# Patient Record
Sex: Female | Born: 1950 | Race: Black or African American | Hispanic: No | Smoking: Never smoker
Health system: Southern US, Community
[De-identification: ages and names within clinical notes are randomized; demographics above are authoritative.]

## PROBLEM LIST (undated history)

## (undated) DIAGNOSIS — E079 Disorder of thyroid, unspecified: Secondary | ICD-10-CM

## (undated) DIAGNOSIS — I10 Essential (primary) hypertension: Secondary | ICD-10-CM

## (undated) HISTORY — PX: ABDOMINAL HYSTERECTOMY: SHX81

## (undated) HISTORY — PX: ELBOW SURGERY: SHX618

---

## 2000-12-25 ENCOUNTER — Encounter: Payer: Self-pay | Admitting: Obstetrics and Gynecology

## 2000-12-25 ENCOUNTER — Ambulatory Visit (HOSPITAL_COMMUNITY): Admission: RE | Admit: 2000-12-25 | Discharge: 2000-12-25 | Payer: Self-pay | Admitting: Obstetrics and Gynecology

## 2001-02-10 ENCOUNTER — Ambulatory Visit (HOSPITAL_COMMUNITY): Admission: RE | Admit: 2001-02-10 | Discharge: 2001-02-10 | Payer: Self-pay | Admitting: Family Medicine

## 2001-02-10 ENCOUNTER — Encounter: Payer: Self-pay | Admitting: Family Medicine

## 2003-04-06 ENCOUNTER — Ambulatory Visit (HOSPITAL_COMMUNITY): Admission: RE | Admit: 2003-04-06 | Discharge: 2003-04-06 | Payer: Self-pay | Admitting: Family Medicine

## 2003-05-19 ENCOUNTER — Ambulatory Visit (HOSPITAL_COMMUNITY): Admission: RE | Admit: 2003-05-19 | Discharge: 2003-05-19 | Payer: Self-pay | Admitting: Internal Medicine

## 2004-04-27 ENCOUNTER — Ambulatory Visit (HOSPITAL_COMMUNITY): Admission: RE | Admit: 2004-04-27 | Discharge: 2004-04-27 | Payer: Self-pay | Admitting: Internal Medicine

## 2004-09-14 ENCOUNTER — Ambulatory Visit (HOSPITAL_COMMUNITY): Admission: RE | Admit: 2004-09-14 | Discharge: 2004-09-14 | Payer: Self-pay | Admitting: Family Medicine

## 2005-04-30 ENCOUNTER — Ambulatory Visit (HOSPITAL_COMMUNITY): Admission: RE | Admit: 2005-04-30 | Discharge: 2005-04-30 | Payer: Self-pay | Admitting: Family Medicine

## 2006-08-20 ENCOUNTER — Ambulatory Visit (HOSPITAL_COMMUNITY): Admission: RE | Admit: 2006-08-20 | Discharge: 2006-08-20 | Payer: Self-pay | Admitting: Family Medicine

## 2006-12-08 ENCOUNTER — Other Ambulatory Visit: Admission: RE | Admit: 2006-12-08 | Discharge: 2006-12-08 | Payer: Self-pay | Admitting: Obstetrics & Gynecology

## 2008-01-08 ENCOUNTER — Ambulatory Visit (HOSPITAL_COMMUNITY): Admission: RE | Admit: 2008-01-08 | Discharge: 2008-01-08 | Payer: Self-pay | Admitting: Family Medicine

## 2008-04-29 ENCOUNTER — Other Ambulatory Visit: Admission: RE | Admit: 2008-04-29 | Discharge: 2008-04-29 | Payer: Self-pay | Admitting: Family Medicine

## 2008-09-05 ENCOUNTER — Encounter: Admission: RE | Admit: 2008-09-05 | Discharge: 2008-09-05 | Payer: Self-pay | Admitting: Family Medicine

## 2009-01-11 ENCOUNTER — Ambulatory Visit (HOSPITAL_COMMUNITY): Admission: RE | Admit: 2009-01-11 | Discharge: 2009-01-11 | Payer: Self-pay | Admitting: Family Medicine

## 2009-03-24 ENCOUNTER — Other Ambulatory Visit: Admission: RE | Admit: 2009-03-24 | Discharge: 2009-03-24 | Payer: Self-pay | Admitting: Obstetrics and Gynecology

## 2010-06-29 NOTE — Op Note (Signed)
NAME:  Brittany Diaz, Brittany Diaz                        ACCOUNT NO.:  [id number]   MEDICAL RECORD NO.:  [id number]                   PATIENT TYPE:  AMB   LOCATION:  DAY                                  FACILITY:  [facility]   PHYSICIAN:  R. Roetta Sessions, M.D.              DATE OF BIRTH:  [birth year]   DATE OF PROCEDURE:  05/19/2003  DATE OF DISCHARGE:                                 OPERATIVE REPORT   PROCEDURE:  Screening colonoscopy.   INDICATIONS FOR PROCEDURE:  The patient is a 60 year old lady who comes for  screening colonoscopy.  She is devoid of any lower GI tract symptoms.  There  is no family history of colorectal neoplasia.  She has never had her colon  imaged previously.  Colonoscopy is now being done as a standard screening  maneuver.  This approach has been discussed with the patient at length.  The  potential risks, benefits, and alternatives have been reviewed and questions  answered.  She is agreeable.  Please see my handwritten H&P for more  information.   PROCEDURE:  O2 saturation, blood pressure, pulses, and respirations were  monitored throughout the entirety of the procedure.  Conscious sedation was  with Versed 2 mg IV, Demerol 50 mg IV in divided doses.  The instrument used  was the Olympus pediatric colonoscope.   FINDINGS:  Digital rectal examination revealed no abnormalities.   ENDOSCOPIC FINDINGS:  The prep was good.   Rectum:  Examination of the rectal mucosa including retroflex view of the  anal verge revealed no abnormalities.   Colon:  The colonic mucosa was surveyed from the rectosigmoid junction  through the left, transverse, right colon to the area of the appendiceal  orifice, ileocecal valve, and cecum.  These structures were well-seen and  photographed for the record.  From this level, the scope was slowly  withdrawn.  All previously mentioned mucosal surfaces were again seen.  The  distal terminal ileum was also intubated.  The colon and terminal ileum  appeared normal.  The patient tolerated the procedure well and was reactive  in endoscopy.   IMPRESSION:  1. Normal rectum.  2. Normal colon.  3. Normal terminal ileum.   RECOMMENDATIONS:  Repeat colonoscopy in 10 years.      ___________________________________________                                            Brittany Diaz, M.D.   RMR/MEDQ  D:  05/19/2003  T:  05/19/2003  Job:  [id number]

## 2011-10-15 ENCOUNTER — Other Ambulatory Visit (HOSPITAL_COMMUNITY): Payer: Self-pay | Admitting: Nurse Practitioner

## 2011-10-15 DIAGNOSIS — Z139 Encounter for screening, unspecified: Secondary | ICD-10-CM

## 2011-10-17 ENCOUNTER — Ambulatory Visit (HOSPITAL_COMMUNITY)
Admission: RE | Admit: 2011-10-17 | Discharge: 2011-10-17 | Disposition: A | Discharge status: Home / Self Care | Payer: PRIVATE HEALTH INSURANCE | Source: Ambulatory Visit | Attending: Nurse Practitioner | Admitting: Nurse Practitioner

## 2011-10-17 DIAGNOSIS — R922 Inconclusive mammogram: Secondary | ICD-10-CM | POA: Insufficient documentation

## 2011-10-17 DIAGNOSIS — Z139 Encounter for screening, unspecified: Secondary | ICD-10-CM

## 2011-10-21 ENCOUNTER — Other Ambulatory Visit (HOSPITAL_COMMUNITY): Payer: Self-pay | Admitting: Nurse Practitioner

## 2011-10-21 ENCOUNTER — Other Ambulatory Visit: Payer: Self-pay | Admitting: Nurse Practitioner

## 2011-10-21 DIAGNOSIS — R928 Other abnormal and inconclusive findings on diagnostic imaging of breast: Secondary | ICD-10-CM

## 2011-10-30 ENCOUNTER — Ambulatory Visit (HOSPITAL_COMMUNITY)
Admission: RE | Admit: 2011-10-30 | Discharge: 2011-10-30 | Disposition: A | Discharge status: Home / Self Care | Payer: PRIVATE HEALTH INSURANCE | Source: Ambulatory Visit | Attending: Nurse Practitioner | Admitting: Nurse Practitioner

## 2011-10-30 ENCOUNTER — Other Ambulatory Visit (HOSPITAL_COMMUNITY): Payer: Self-pay | Admitting: Nurse Practitioner

## 2011-10-30 DIAGNOSIS — R928 Other abnormal and inconclusive findings on diagnostic imaging of breast: Secondary | ICD-10-CM

## 2011-11-08 ENCOUNTER — Encounter (HOSPITAL_COMMUNITY): Payer: Self-pay | Admitting: *Deleted

## 2011-11-08 ENCOUNTER — Emergency Department (HOSPITAL_COMMUNITY)
Admission: EM | Admit: 2011-11-08 | Discharge: 2011-11-08 | Disposition: A | Discharge status: Home / Self Care | Payer: Self-pay | Attending: Emergency Medicine | Admitting: Emergency Medicine

## 2011-11-08 ENCOUNTER — Emergency Department (HOSPITAL_COMMUNITY): Payer: Self-pay

## 2011-11-08 DIAGNOSIS — I10 Essential (primary) hypertension: Secondary | ICD-10-CM | POA: Insufficient documentation

## 2011-11-08 DIAGNOSIS — M545 Low back pain, unspecified: Secondary | ICD-10-CM | POA: Insufficient documentation

## 2011-11-08 DIAGNOSIS — M549 Dorsalgia, unspecified: Secondary | ICD-10-CM

## 2011-11-08 DIAGNOSIS — R062 Wheezing: Secondary | ICD-10-CM | POA: Insufficient documentation

## 2011-11-08 DIAGNOSIS — IMO0001 Reserved for inherently not codable concepts without codable children: Secondary | ICD-10-CM | POA: Insufficient documentation

## 2011-11-08 DIAGNOSIS — E079 Disorder of thyroid, unspecified: Secondary | ICD-10-CM | POA: Insufficient documentation

## 2011-11-08 HISTORY — DX: Disorder of thyroid, unspecified: E07.9

## 2011-11-08 HISTORY — DX: Essential (primary) hypertension: I10

## 2011-11-08 MED ORDER — IBUPROFEN 800 MG PO TABS: 800.0000 mg | ORAL_TABLET | Freq: Three times a day (TID) | ORAL | Status: DC

## 2011-11-08 MED ORDER — CYCLOBENZAPRINE HCL 10 MG PO TABS: 10.0000 mg | ORAL_TABLET | Freq: Two times a day (BID) | ORAL | Status: DC | PRN

## 2011-11-08 MED ORDER — ALBUTEROL SULFATE HFA 108 (90 BASE) MCG/ACT IN AERS: 2.0000 | INHALATION_SPRAY | RESPIRATORY_TRACT | Status: DC | PRN

## 2011-11-08 NOTE — ED Notes (Addendum)
Pt c/o lower back pain that is worse with  Movement, started two weeks ago, denies any injury, pt states that she did fall down the steps a few months ago and has started walking again for exercise, does not know if those are related. Denies any uti symptoms, pt also states that she wants to be seen for wheezing, states that she has noticed that she has been wheezing "some" lately.

## 2011-11-08 NOTE — ED Provider Notes (Signed)
History  This chart was scribed for Brittany Hutching, MD by Bennett Scrape. This patient was seen in room [facility] and the patient's care was started at 1055.  CSN: [id number]  Arrival date & time 11/08/11  1014   First MD Initiated Contact with Patient 11/08/11 1055      Chief Complaint  Patient presents with  . Back Pain  . Wheezing     The history is provided by the patient. No language interpreter was used.    Brittany Diaz is a 61 y.o. female who presents to the Emergency Department complaining of 2 weeks of gradual onset, gradually worsening, constant back pain with associated wheezing. The pain is worse with movement. She reports that she fell down the steps 3 months ago but denies being evaluated in the ED after the incident. She reports that the pain was improving until she resumed walking exercise recently. She denies any recent falls or injuries. She denies fever, leg weakness, numbness and loss of bowels or bladder as associated symptoms. She has a h/o thyroid disease and HTN. She denies smoking and alcohol use.   Past Medical History  Diagnosis Date  . Thyroid disease   . Hypertension     Past Surgical History  Procedure Date  . Elbow surgery   . Cesarean section   . Abdominal hysterectomy     No family history on file.  History  Substance Use Topics  . Smoking status: Never Smoker   . Smokeless tobacco: Not on file  . Alcohol Use: No    No OB history provided.  Review of Systems  A complete 10 system review of systems was obtained and all systems are negative except as noted in the HPI and PMH.   Allergies  Review of patient's allergies indicates no known allergies.  Home Medications   Current Outpatient Rx  Name Route Sig Dispense Refill  . IBUPROFEN 200 MG PO TABS Oral Take 200 mg by mouth every 8 (eight) hours as needed. pain    . LEVOTHYROXINE SODIUM 75 MCG PO TABS Oral Take 75 mcg by mouth daily.    . TRIAMTERENE-HCTZ 37.5-25 MG PO TABS  Oral Take 1 tablet by mouth daily.      Triage Vitals: BP 173/88  Pulse 63  Temp 97.9 F (36.6 C)  Resp 20  Ht 5\' 4"  (1.626 m)  Wt 176 lb (79.833 kg)  BMI 30.21 kg/m2  SpO2 99%  Physical Exam  Nursing note and vitals reviewed. Constitutional: She is oriented to person, place, and time. She appears well-developed and well-nourished. No distress.  HENT:  Head: Normocephalic and atraumatic.  Eyes: Conjunctivae normal and EOM are normal.  Neck: Neck supple. No tracheal deviation present.  Cardiovascular: Normal rate and regular rhythm.   Pulmonary/Chest: Effort normal and breath sounds normal. No respiratory distress.  Musculoskeletal: Normal range of motion. She exhibits tenderness. She exhibits no edema.       Slightly tender on superior buttocks bilaterally  Neurological: She is alert and oriented to person, place, and time.  Skin: Skin is warm and dry.  Psychiatric: She has a normal mood and affect. Her behavior is normal.    ED Course  Procedures (including critical care time)  DIAGNOSTIC STUDIES: Oxygen Saturation is 99% on room air, normal by my interpretation.    COORDINATION OF CARE: 1139-Discussed treatment plan which includes a CXR with pt at bedside and pt agreed to plan.  No results found for this or any previous visit.  Dg Chest 2 View  11/08/2011  *RADIOLOGY REPORT*  Clinical Data: Wheezing  CHEST - 2 VIEW  Comparison: None.  Findings: Cardiomediastinal contours are within normal range. Lungs are predominately clear.  No pleural effusion or pneumothorax.  Upper/mid thoracic degenerative changes.  No acute osseous abnormality identified.  IMPRESSION: No radiographic evidence of acute cardiopulmonary process.   Original Report Authenticated By: Waneta Martins, M.D.       No diagnosis found.    MDM  Back pain is actually located in her superior buttocks area. Suspect muscular etiology. Chest x-ray obtained secondary to wheezing. chest X-ray normal.  Discharged on ibuprofen, Flexeril, albuterol inhaler.      I personally performed the services described in this documentation, which was scribed in my presence. The recorded information has been reviewed and considered.    Brittany Hutching, MD 11/08/11 1451

## 2012-04-13 ENCOUNTER — Other Ambulatory Visit (HOSPITAL_COMMUNITY): Payer: Self-pay | Admitting: Family Medicine

## 2012-04-13 DIAGNOSIS — R928 Other abnormal and inconclusive findings on diagnostic imaging of breast: Secondary | ICD-10-CM

## 2012-04-29 ENCOUNTER — Ambulatory Visit (HOSPITAL_COMMUNITY)
Admission: RE | Admit: 2012-04-29 | Discharge: 2012-04-29 | Disposition: A | Discharge status: Home / Self Care | Payer: Self-pay | Source: Ambulatory Visit | Attending: Family Medicine | Admitting: Family Medicine

## 2012-04-29 DIAGNOSIS — R928 Other abnormal and inconclusive findings on diagnostic imaging of breast: Secondary | ICD-10-CM

## 2012-07-14 ENCOUNTER — Other Ambulatory Visit (HOSPITAL_COMMUNITY): Payer: Self-pay | Admitting: Nurse Practitioner

## 2012-07-14 DIAGNOSIS — Z09 Encounter for follow-up examination after completed treatment for conditions other than malignant neoplasm: Secondary | ICD-10-CM

## 2012-08-12 ENCOUNTER — Ambulatory Visit (HOSPITAL_COMMUNITY)
Admission: RE | Admit: 2012-08-12 | Discharge: 2012-08-12 | Disposition: A | Discharge status: Home / Self Care | Payer: PRIVATE HEALTH INSURANCE | Source: Ambulatory Visit | Attending: Nurse Practitioner | Admitting: Nurse Practitioner

## 2012-08-12 DIAGNOSIS — Z09 Encounter for follow-up examination after completed treatment for conditions other than malignant neoplasm: Secondary | ICD-10-CM

## 2012-08-12 DIAGNOSIS — R928 Other abnormal and inconclusive findings on diagnostic imaging of breast: Secondary | ICD-10-CM | POA: Insufficient documentation

## 2013-04-07 IMAGING — CR DG CHEST 2V
2 series · 2 of 2 positions shown · non-contrast
Comparison: None.

CLINICAL DATA: Wheezing

CHEST - 2 VIEW

[view not recorded (1 of 2)]
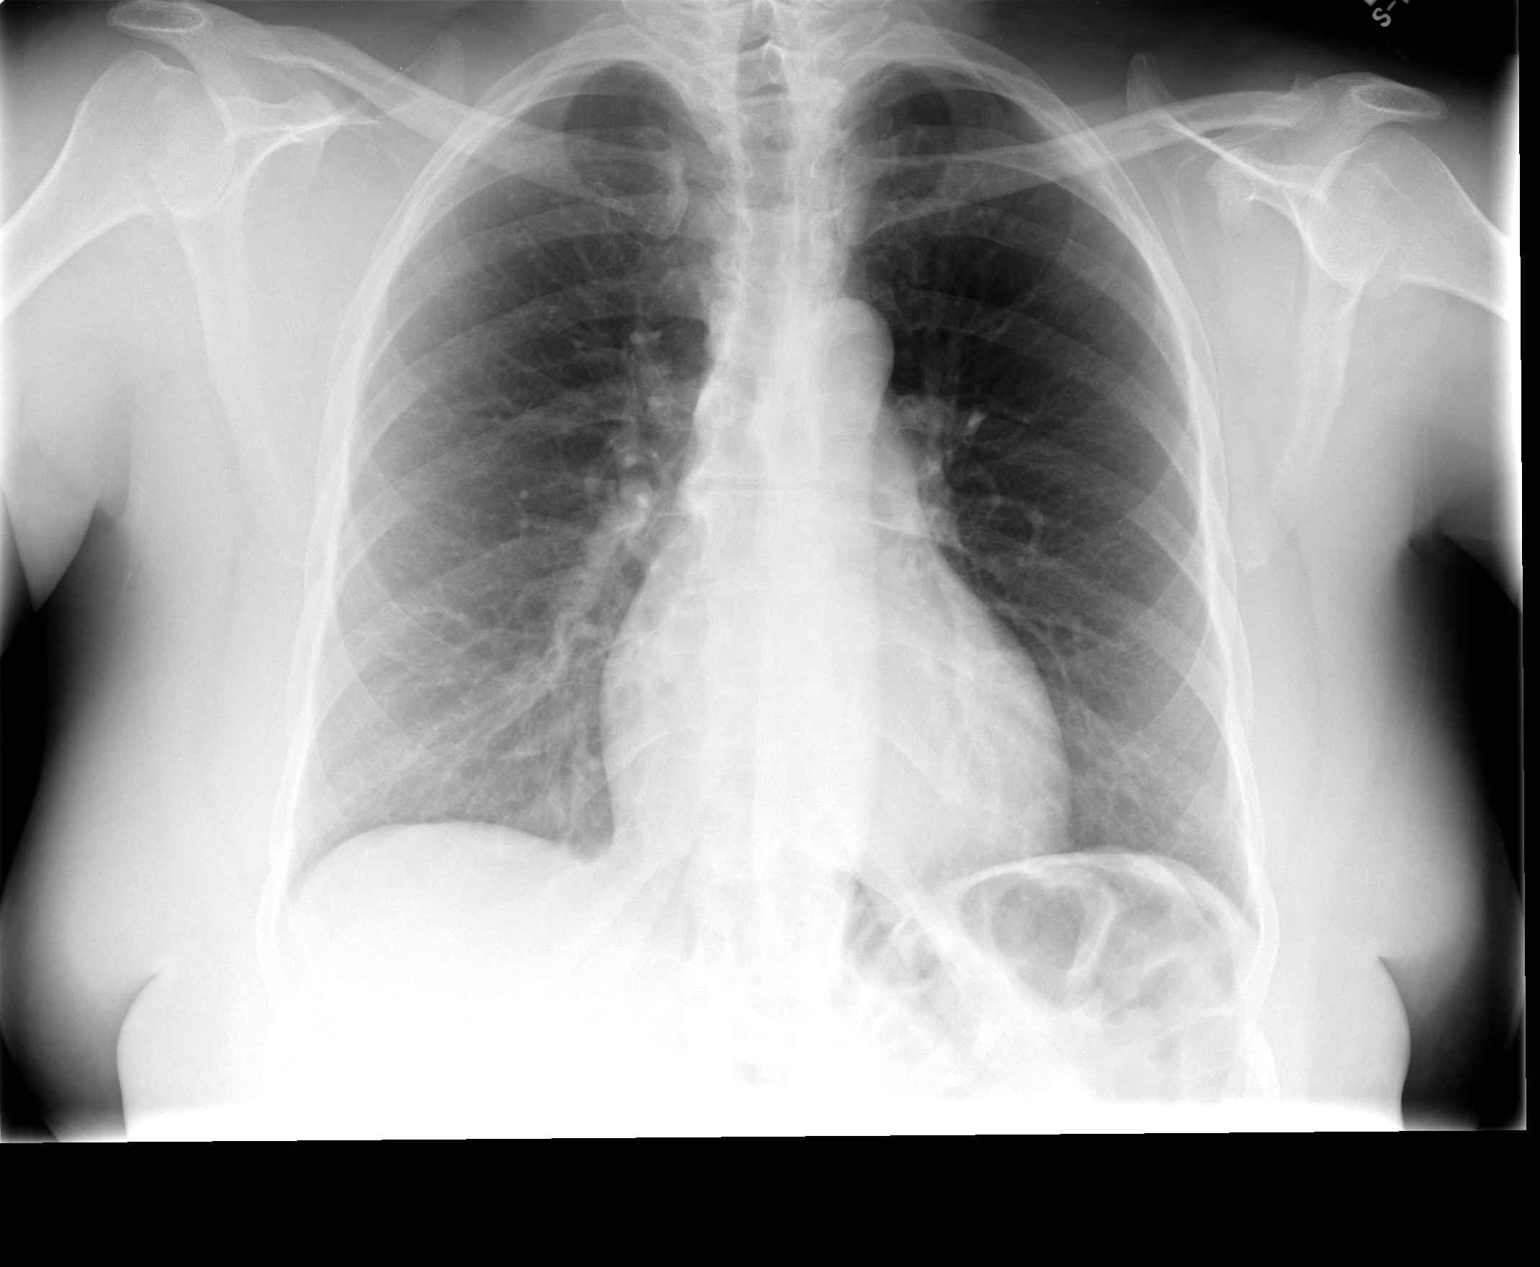

[view not recorded (2 of 2)]
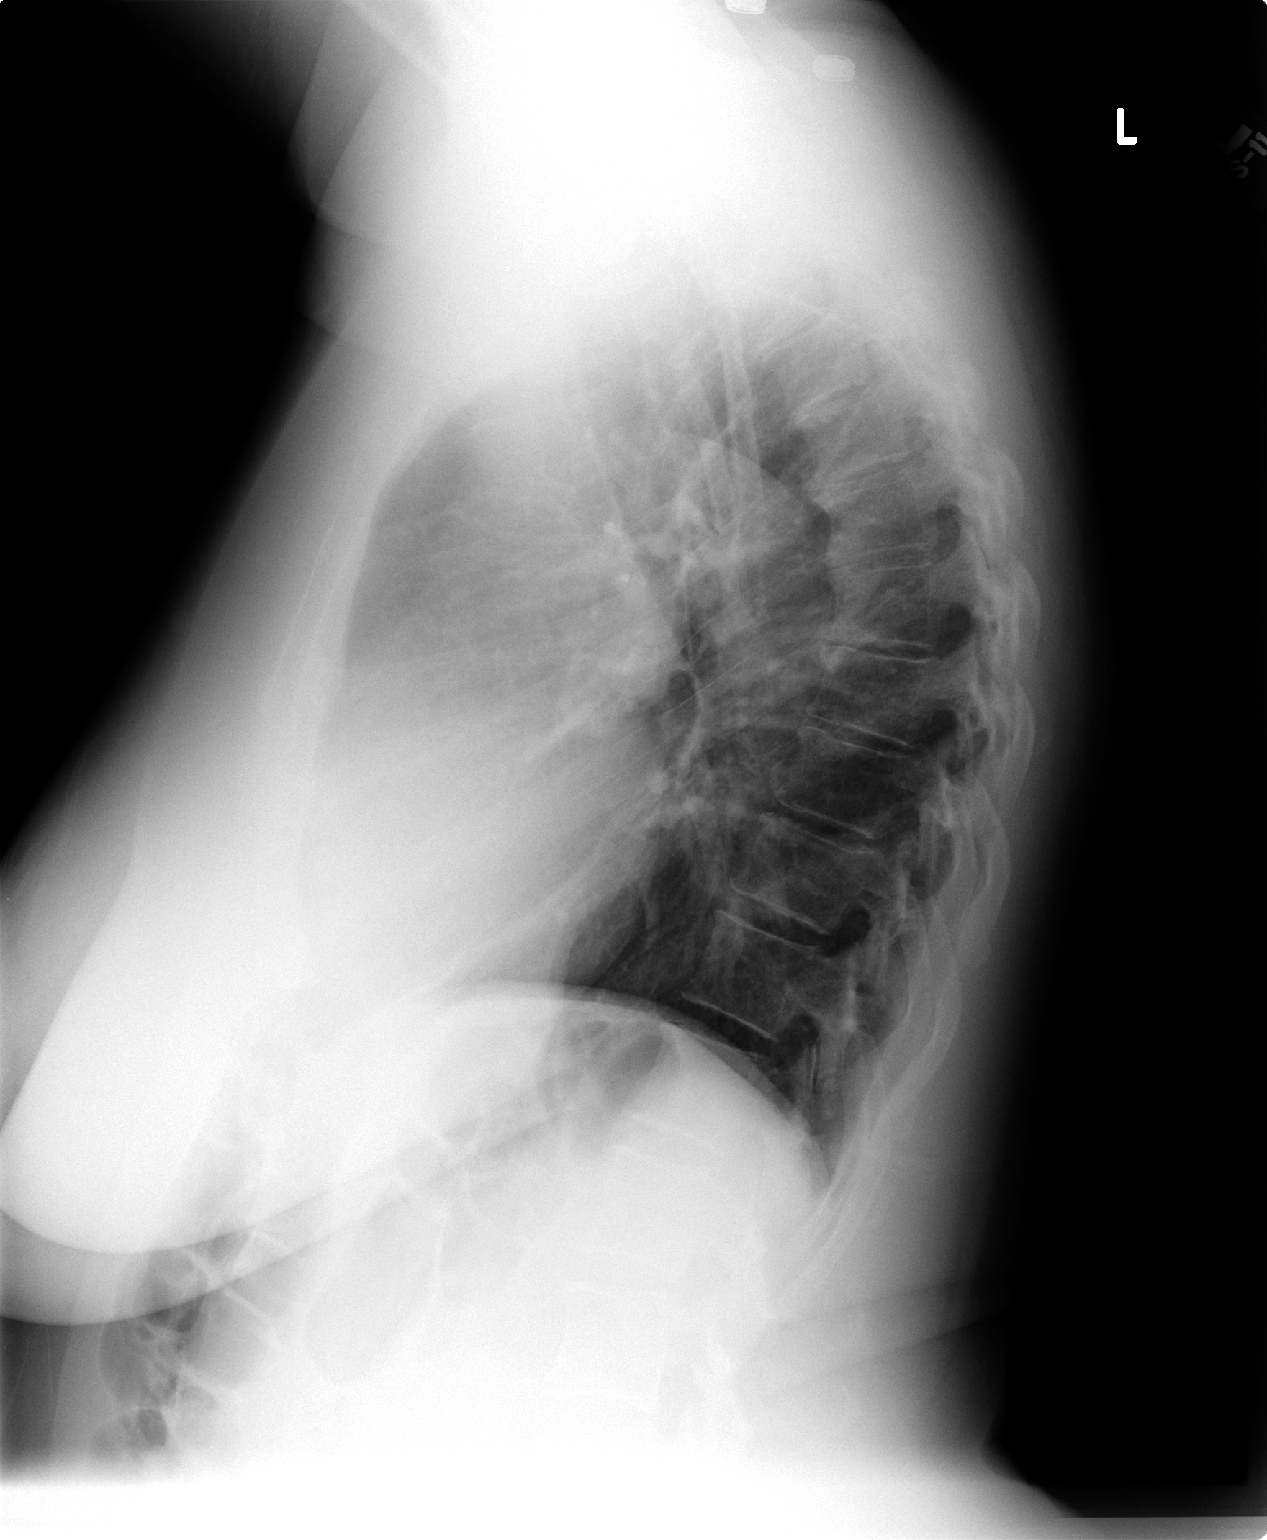

[2 of 2 positions shown; findings below may reference images not displayed]

FINDINGS: Cardiomediastinal contours are within normal range.
Lungs are predominately clear.  No pleural effusion or
pneumothorax.  Upper/mid thoracic degenerative changes.  No acute
osseous abnormality identified.
IMPRESSION: No radiographic evidence of acute cardiopulmonary process.

## 2013-08-26 ENCOUNTER — Other Ambulatory Visit (HOSPITAL_COMMUNITY): Payer: Self-pay | Admitting: *Deleted

## 2013-08-26 DIAGNOSIS — Z09 Encounter for follow-up examination after completed treatment for conditions other than malignant neoplasm: Secondary | ICD-10-CM

## 2013-08-31 ENCOUNTER — Encounter (HOSPITAL_COMMUNITY): Payer: Self-pay

## 2013-09-07 ENCOUNTER — Ambulatory Visit (HOSPITAL_COMMUNITY)
Admission: RE | Admit: 2013-09-07 | Discharge: 2013-09-07 | Disposition: A | Discharge status: Home / Self Care | Payer: PRIVATE HEALTH INSURANCE | Source: Ambulatory Visit | Attending: *Deleted | Admitting: *Deleted

## 2013-09-07 DIAGNOSIS — Z09 Encounter for follow-up examination after completed treatment for conditions other than malignant neoplasm: Secondary | ICD-10-CM

## 2014-04-01 ENCOUNTER — Ambulatory Visit (INDEPENDENT_AMBULATORY_CARE_PROVIDER_SITE_OTHER): Payer: Self-pay | Admitting: Family Medicine

## 2014-04-01 ENCOUNTER — Encounter: Payer: Self-pay | Admitting: Family Medicine

## 2014-04-01 VITALS — BP 144/82 | Ht 64.0 in | Wt 191.5 lb

## 2014-04-01 DIAGNOSIS — E039 Hypothyroidism, unspecified: Secondary | ICD-10-CM

## 2014-04-01 DIAGNOSIS — I10 Essential (primary) hypertension: Secondary | ICD-10-CM

## 2014-04-01 MED ORDER — LEVOTHYROXINE SODIUM 75 MCG PO TABS: 75.0000 ug | ORAL_TABLET | Freq: Every day | ORAL | Status: DC

## 2014-04-01 MED ORDER — TRIAMTERENE-HCTZ 37.5-25 MG PO TABS: 1.0000 | ORAL_TABLET | Freq: Every day | ORAL | Status: DC

## 2014-04-01 NOTE — Progress Notes (Signed)
   Subjective:    Patient ID: Brittany Diaz, female    DOB: 09/20/1950, 64 y.o.   MRN: [id number]  Hypertension This is a chronic problem. The current episode started more than 1 year ago. The problem has been gradually improving since onset. The problem is controlled. There are no associated agents to hypertension. There are no known risk factors for coronary artery disease. Treatments tried: Triamterene-HCTZ. The current treatment provides significant improvement. There are no compliance problems.   Patient states that she has bilateral swelling in her feet. She has been off her medication for about 2 weeks now.   Patient arrives after a multi year hiatus. Has been going to the health department for free care. Unfortunately current doctor has left. Next  Patient claims she got blood work within the last year  Review of Systems No headache no chest pain no back pain no abdominal pain    Objective:   Physical Exam  Alert blood pressure 1 4492 H&T normal lungs clear heart regular rhythm ankles trace edema at most      Assessment & Plan:  Impression 1 hypertension suboptimal control with recent noncompliance secondary to unable to get medicines from health department #2 hypothyroidism plan medications refilled. Diet exercise discussed. Recheck in 6 months. WSL

## 2014-09-28 ENCOUNTER — Ambulatory Visit: Payer: Self-pay | Admitting: Family Medicine

## 2014-10-11 ENCOUNTER — Other Ambulatory Visit: Payer: Self-pay | Admitting: Family Medicine

## 2014-10-13 ENCOUNTER — Other Ambulatory Visit: Payer: Self-pay | Admitting: Family Medicine

## 2014-10-14 ENCOUNTER — Other Ambulatory Visit: Payer: Self-pay | Admitting: Family Medicine

## 2014-11-01 ENCOUNTER — Encounter: Payer: Self-pay | Admitting: Family Medicine

## 2014-11-01 ENCOUNTER — Ambulatory Visit (INDEPENDENT_AMBULATORY_CARE_PROVIDER_SITE_OTHER): Payer: Self-pay | Admitting: Family Medicine

## 2014-11-01 VITALS — BP 128/82 | Ht 64.0 in | Wt 194.6 lb

## 2014-11-01 DIAGNOSIS — E039 Hypothyroidism, unspecified: Secondary | ICD-10-CM

## 2014-11-01 DIAGNOSIS — I10 Essential (primary) hypertension: Secondary | ICD-10-CM

## 2014-11-01 DIAGNOSIS — Z0000 Encounter for general adult medical examination without abnormal findings: Secondary | ICD-10-CM

## 2014-11-01 MED ORDER — TRIAMTERENE-HCTZ 37.5-25 MG PO TABS: 1.0000 | ORAL_TABLET | Freq: Every day | ORAL | Status: DC

## 2014-11-01 MED ORDER — LEVOTHYROXINE SODIUM 75 MCG PO TABS: 75.0000 ug | ORAL_TABLET | Freq: Every day | ORAL | Status: DC

## 2014-11-01 NOTE — Progress Notes (Signed)
   Subjective:    Patient ID: Brittany Diaz, female    DOB: 23-Nov-1950, 63 y.o.   MRN: [id number]  HPI sees dr Emelda Fear for preventive checkups Additional concerns: stays sleepy all the time  Patient on blood pressure medicine. Reports compliance. Blood pressure good when checked elsewhere. Watching salt intake.  Compliant with thyroid medicine. No symptoms of fatigue excessive weight gain etc.  Review of Systems No headache no chest pain and back pain no abdominal pain no change in bowel habits    Objective:   Physical Exam  Alert vitals stable. Lungs clear. Heart regular in rhythm. HEENT normal      Assessment & Plan:  Impression 1 hypothyroidism #2 hyperlipidemia plan appropriate blood work. Patient declines all preventive measures secondary to noninsurance. Recheck in 6 months diet exercise discussed in encourage WSL

## 2014-11-05 LAB — LIPID PANEL
CHOLESTEROL TOTAL: 189 mg/dL (ref 100–199)
Chol/HDL Ratio: 4 ratio units (ref 0.0–4.4)
HDL: 47 mg/dL (ref >39)
LDL CALC: 122 mg/dL — AB (ref 0–99)
Triglycerides: 100 mg/dL (ref 0–149)
VLDL CHOLESTEROL CAL: 20 mg/dL (ref 5–40)

## 2014-11-05 LAB — BASIC METABOLIC PANEL
BUN / CREAT RATIO: 13 (ref 11–26)
BUN: 13 mg/dL (ref 8–27)
CO2: 23 mmol/L (ref 18–29)
Calcium: 9.6 mg/dL (ref 8.7–10.3)
Chloride: 102 mmol/L (ref 97–108)
Creatinine, Ser: 1.04 mg/dL — ABNORMAL HIGH (ref 0.57–1.00)
GFR, EST AFRICAN AMERICAN: 67 mL/min/{1.73_m2} (ref >59)
GFR, EST NON AFRICAN AMERICAN: 58 mL/min/{1.73_m2} — AB (ref >59)
Glucose: 113 mg/dL — ABNORMAL HIGH (ref 65–99)
POTASSIUM: 4 mmol/L (ref 3.5–5.2)
SODIUM: 145 mmol/L — AB (ref 134–144)

## 2014-11-05 LAB — TSH: TSH: 2.72 u[IU]/mL (ref 0.450–4.500)

## 2015-05-02 ENCOUNTER — Ambulatory Visit: Payer: Self-pay | Admitting: Family Medicine

## 2016-03-05 ENCOUNTER — Ambulatory Visit (INDEPENDENT_AMBULATORY_CARE_PROVIDER_SITE_OTHER): Payer: Self-pay | Admitting: Family Medicine

## 2016-03-05 ENCOUNTER — Encounter: Payer: Self-pay | Admitting: Family Medicine

## 2016-03-05 VITALS — BP 130/88 | Ht 64.0 in | Wt 188.0 lb

## 2016-03-05 DIAGNOSIS — I10 Essential (primary) hypertension: Secondary | ICD-10-CM

## 2016-03-05 DIAGNOSIS — E039 Hypothyroidism, unspecified: Secondary | ICD-10-CM

## 2016-03-05 MED ORDER — LEVOTHYROXINE SODIUM 75 MCG PO TABS: 75.0000 ug | ORAL_TABLET | Freq: Every day | ORAL | 11 refills | Status: DC

## 2016-03-05 MED ORDER — TRIAMTERENE-HCTZ 37.5-25 MG PO TABS: 1.0000 | ORAL_TABLET | Freq: Every day | ORAL | 11 refills | Status: DC

## 2016-03-05 NOTE — Progress Notes (Signed)
   Subjective:    Patient ID: Brittany Diaz, female    DOB: [birth year], 66 y.o.   MRN: [id number]  Hypertension  This is a chronic problem. The current episode started more than 1 year ago. The problem has been gradually improving since onset. There are no associated agents to hypertension. There are no known risk factors for coronary artery disease. Treatments tried: triamterene-hctz. The current treatment provides moderate improvement. Compliance problems: Patient has not been taking medication.    Patient has right leg discomfort only while she sleeps. Gets to aching pretty bad at times. Worse at night takes two advil prn.Patient states Myna [employer] been going on for few weeks recalls no injury  Patient ran out of her thyroid medicine at least 6 months ago. States has no money for blood testing Review of Systems No headache, no major weight loss or weight gain, no chest pain no back pain abdominal pain no change in bowel habits complete ROS otherwise negative     Objective:   Physical Exam Alert vitals stable, NAD. Blood pressure good on repeat. HEENT normal. Lungs clear. Heart regular rate and rhythm. Blood pressure 152/90 on repeat       Assessment & Plan:  Impression 1 hypertension suboptimal control with noncompliance #2 hypothyroidism suboptimum control likely with noncompliance plan patient states has 0 money for blood work. Will refill medication diet exercise discussed. Will need to do blood work on next visit, we'll also stretch out one whole year with patient requesting medicine noninsured WSL patient declines all pertinent measures, encouraged to follow-up of leg pain persists

## 2017-03-10 ENCOUNTER — Telehealth: Payer: Self-pay | Admitting: Family Medicine

## 2017-03-10 DIAGNOSIS — E039 Hypothyroidism, unspecified: Secondary | ICD-10-CM

## 2017-03-10 DIAGNOSIS — I10 Essential (primary) hypertension: Secondary | ICD-10-CM

## 2017-03-10 NOTE — Telephone Encounter (Signed)
Pt is requesting labs to be sent over for an upcoming appt. Last labs per epic were: bmp,lipid,and tsh on 11/04/14.

## 2017-03-10 NOTE — Telephone Encounter (Signed)
Same plus liver

## 2017-03-10 NOTE — Telephone Encounter (Signed)
Blood work ordered in Epic. Patient notified. 

## 2017-03-14 DIAGNOSIS — I10 Essential (primary) hypertension: Secondary | ICD-10-CM | POA: Diagnosis not present

## 2017-03-14 DIAGNOSIS — E039 Hypothyroidism, unspecified: Secondary | ICD-10-CM | POA: Diagnosis not present

## 2017-03-15 LAB — HEPATIC FUNCTION PANEL
ALT: 17 IU/L (ref 0–32)
AST: 17 IU/L (ref 0–40)
Albumin: 4.4 g/dL (ref 3.6–4.8)
Alkaline Phosphatase: 99 IU/L (ref 39–117)
Bilirubin Total: 0.3 mg/dL (ref 0.0–1.2)
Bilirubin, Direct: 0.09 mg/dL (ref 0.00–0.40)
Total Protein: 7.6 g/dL (ref 6.0–8.5)

## 2017-03-15 LAB — LIPID PANEL
CHOL/HDL RATIO: 4.1 ratio (ref 0.0–4.4)
Cholesterol, Total: 180 mg/dL (ref 100–199)
HDL: 44 mg/dL (ref >39)
LDL CALC: 109 mg/dL — AB (ref 0–99)
TRIGLYCERIDES: 133 mg/dL (ref 0–149)
VLDL CHOLESTEROL CAL: 27 mg/dL (ref 5–40)

## 2017-03-15 LAB — BASIC METABOLIC PANEL
BUN/Creatinine Ratio: 10 — ABNORMAL LOW (ref 12–28)
BUN: 11 mg/dL (ref 8–27)
CALCIUM: 9.9 mg/dL (ref 8.7–10.3)
CHLORIDE: 100 mmol/L (ref 96–106)
CO2: 24 mmol/L (ref 20–29)
Creatinine, Ser: 1.15 mg/dL — ABNORMAL HIGH (ref 0.57–1.00)
GFR calc Af Amer: 58 mL/min/{1.73_m2} — ABNORMAL LOW (ref >59)
GFR calc non Af Amer: 50 mL/min/{1.73_m2} — ABNORMAL LOW (ref >59)
GLUCOSE: 106 mg/dL — AB (ref 65–99)
Potassium: 4.5 mmol/L (ref 3.5–5.2)
Sodium: 140 mmol/L (ref 134–144)

## 2017-03-15 LAB — TSH: TSH: 1.78 u[IU]/mL (ref 0.450–4.500)

## 2017-03-18 ENCOUNTER — Encounter: Payer: Self-pay | Admitting: Family Medicine

## 2017-03-18 ENCOUNTER — Ambulatory Visit (INDEPENDENT_AMBULATORY_CARE_PROVIDER_SITE_OTHER): Payer: Medicare HMO | Admitting: Family Medicine

## 2017-03-18 VITALS — BP 146/92 | Ht 64.0 in | Wt 199.2 lb

## 2017-03-18 DIAGNOSIS — G5601 Carpal tunnel syndrome, right upper limb: Secondary | ICD-10-CM | POA: Diagnosis not present

## 2017-03-18 DIAGNOSIS — I10 Essential (primary) hypertension: Secondary | ICD-10-CM

## 2017-03-18 DIAGNOSIS — E039 Hypothyroidism, unspecified: Secondary | ICD-10-CM

## 2017-03-18 MED ORDER — TRIAMTERENE-HCTZ 37.5-25 MG PO TABS: 1.0000 | ORAL_TABLET | Freq: Every day | ORAL | 5 refills | Status: DC

## 2017-03-18 MED ORDER — LEVOTHYROXINE SODIUM 75 MCG PO TABS: 75.0000 ug | ORAL_TABLET | Freq: Every day | ORAL | 11 refills | Status: DC

## 2017-03-18 NOTE — Progress Notes (Signed)
Subjective:    Patient ID: Brittany Diaz, female    DOB: 1951/01/17, 67 y.o.   MRN: [id number] Patient arrives with numerous concerns HPI Patient here today for one year follow up on hypertension. Pt stated she woke up this morning with a headache  Blood pressure medicine and blood pressure levels reviewed today with patient. Compliant with blood pressure medicine. States does not miss a dose. No obvious side effects. Blood pressure generally good when checked elsewhere. Watching salt intake.  Thyroid compiant with the meds.  Does not miss a dose.  Blood work reviewed.  Compliant no excessive fatigue  Results for orders placed or performed in visit on 03/10/17  Lipid panel  Result Value Ref Range   Cholesterol, Total 180 100 - 199 mg/dL   Triglycerides 409 0 - 149 mg/dL   HDL 44 >81 mg/dL   VLDL Cholesterol Cal 27 5 - 40 mg/dL   LDL Calculated 191 (H) 0 - 99 mg/dL   Chol/HDL Ratio 4.1 0.0 - 4.4 ratio  Hepatic function panel  Result Value Ref Range   Total Protein 7.6 6.0 - 8.5 g/dL   Albumin 4.4 3.6 - 4.8 g/dL   Bilirubin Total 0.3 0.0 - 1.2 mg/dL   Bilirubin, Direct 4.78 0.00 - 0.40 mg/dL   Alkaline Phosphatase 99 39 - 117 IU/L   AST 17 0 - 40 IU/L   ALT 17 0 - 32 IU/L  Basic metabolic panel  Result Value Ref Range   Glucose 106 (H) 65 - 99 mg/dL   BUN 11 8 - 27 mg/dL   Creatinine, Ser 2.95 (H) 0.57 - 1.00 mg/dL   GFR calc non Af Amer 50 (L) >59 mL/min/1.73   GFR calc Af Amer 58 (L) >59 mL/min/1.73   BUN/Creatinine Ratio 10 (L) 12 - 28   Sodium 140 134 - 144 mmol/L   Potassium 4.5 3.5 - 5.2 mmol/L   Chloride 100 96 - 106 mmol/L   CO2 24 20 - 29 mmol/L   Calcium 9.9 8.7 - 10.3 mg/dL  TSH  Result Value Ref Range   TSH 1.780 0.450 - 4.500 uIU/mL     . Pt states her hair started falling out about 6 months ago and   her right hand becomes numb at times. goesto sleep and has to shake it, also cause s pain .  Wakes up at night sometimes with it like this.  Has to  shake it out.  Becomes weak when it flares up.    Review of Systems No headache, no major weight loss or weight gain, no chest pain no back pain abdominal pain no change in bowel habits complete ROS otherwise negative     Objective:   Physical Exam Alert and oriented, vitals reviewed and stable, NAD ENT-TM's and ext canals WNL bilat via otoscopic exam Soft palate, tonsils and post pharynx WNL via oropharyngeal exam Neck-symmetric, no masses; thyroid nonpalpable and nontender Pulmonary-no tachypnea or accessory muscle use; Clear without wheezes via auscultation Card--no abnrml murmurs, rhythm reg and rate WNL Carotid pulses symmetric, without bruits Right wrist positive Phalen's sign.  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  Doubt related to #2.  May use over-the-counter minoxidil  #4 Carpal Tunl. syndrome discussed nighttime  splints discussed  Recommend wellness exam

## 2017-03-20 ENCOUNTER — Encounter: Payer: Self-pay | Admitting: Family Medicine

## 2017-04-08 ENCOUNTER — Ambulatory Visit (INDEPENDENT_AMBULATORY_CARE_PROVIDER_SITE_OTHER): Payer: Medicare HMO | Admitting: Family Medicine

## 2017-04-08 ENCOUNTER — Encounter: Payer: Self-pay | Admitting: Family Medicine

## 2017-04-08 VITALS — Temp 98.6°F | Ht 64.0 in | Wt 196.8 lb

## 2017-04-08 DIAGNOSIS — J310 Chronic rhinitis: Secondary | ICD-10-CM

## 2017-04-08 DIAGNOSIS — J329 Chronic sinusitis, unspecified: Secondary | ICD-10-CM | POA: Diagnosis not present

## 2017-04-08 MED ORDER — AMOXICILLIN-POT CLAVULANATE 875-125 MG PO TABS: 1.0000 | ORAL_TABLET | Freq: Two times a day (BID) | ORAL | 0 refills | Status: AC

## 2017-04-08 NOTE — Progress Notes (Signed)
   Subjective:    Patient ID: Brittany Diaz, female    DOB: 1951-01-02, 67 y.o.   MRN: [id number]  Cough  This is a new problem. The current episode started 1 to 4 weeks ago. Associated symptoms include a fever, nasal congestion, a sore throat and wheezing. Treatments tried: otc cold meds.    Headache.  From progressive.  He, worse with cough.  Background achiness.  Becomes sharp with cough.  Also worse with change of position.  Using Tylenol as needed    Review of Systems  Constitutional: Positive for fever.  HENT: Positive for sore throat.   Respiratory: Positive for cough and wheezing.        Objective:   Physical Exam  Alert, mild malaise. Hydration good Vitals stable. frontal/ maxillary tenderness evident positive nasal congestion. pharynx normal neck supple  lungs clear/no crackles or wheezes. heart regular in rhythm       Assessment & Plan:  Impression rhinosinusitis likely post viral, discussed with patient. plan antibiotics prescribed. Questions answered. Symptomatic care discussed. warning signs discussed. WSL

## 2017-04-30 ENCOUNTER — Ambulatory Visit (INDEPENDENT_AMBULATORY_CARE_PROVIDER_SITE_OTHER): Payer: Medicare HMO | Admitting: Nurse Practitioner

## 2017-04-30 VITALS — BP 132/84 | Ht 64.0 in | Wt 199.8 lb

## 2017-04-30 DIAGNOSIS — J208 Acute bronchitis due to other specified organisms: Secondary | ICD-10-CM | POA: Diagnosis not present

## 2017-04-30 DIAGNOSIS — Z1231 Encounter for screening mammogram for malignant neoplasm of breast: Secondary | ICD-10-CM | POA: Diagnosis not present

## 2017-04-30 DIAGNOSIS — M79644 Pain in right finger(s): Secondary | ICD-10-CM

## 2017-04-30 DIAGNOSIS — Z1211 Encounter for screening for malignant neoplasm of colon: Secondary | ICD-10-CM | POA: Diagnosis not present

## 2017-04-30 DIAGNOSIS — Z23 Encounter for immunization: Secondary | ICD-10-CM | POA: Diagnosis not present

## 2017-04-30 DIAGNOSIS — B9689 Other specified bacterial agents as the cause of diseases classified elsewhere: Secondary | ICD-10-CM | POA: Diagnosis not present

## 2017-04-30 DIAGNOSIS — Z780 Asymptomatic menopausal state: Secondary | ICD-10-CM

## 2017-04-30 DIAGNOSIS — Z124 Encounter for screening for malignant neoplasm of cervix: Secondary | ICD-10-CM

## 2017-04-30 DIAGNOSIS — Z1151 Encounter for screening for human papillomavirus (HPV): Secondary | ICD-10-CM

## 2017-04-30 DIAGNOSIS — Z01419 Encounter for gynecological examination (general) (routine) without abnormal findings: Secondary | ICD-10-CM

## 2017-04-30 DIAGNOSIS — Z0001 Encounter for general adult medical examination with abnormal findings: Secondary | ICD-10-CM | POA: Diagnosis not present

## 2017-04-30 MED ORDER — ALBUTEROL SULFATE HFA 108 (90 BASE) MCG/ACT IN AERS: 2.0000 | INHALATION_SPRAY | RESPIRATORY_TRACT | 0 refills | Status: AC | PRN

## 2017-04-30 MED ORDER — AZITHROMYCIN 250 MG PO TABS: ORAL_TABLET | ORAL | 0 refills | Status: DC

## 2017-04-30 NOTE — Patient Instructions (Addendum)
Mix equal amounts of water and hydrogen peroxide, put a few drops in each ear before bathing to help clear ear wax.

## 2017-04-30 NOTE — Progress Notes (Signed)
Subjective:    Patient ID: Brittany Diaz, female    DOB: [birth year], 67 y.o.   MRN: [id number]  CC: annual exam  HPI: She is here for her annual exam.  She had an upper respiratory infection a few weeks ago and is still experiencing green nasal drainage, stuffiness, cough, congestion, and itchy throat. She has also had moderate wheezing which wakes her up at night and has affected her ability to exercise; she starts wheezing and has to take breaks during her walks.  Her symptoms occur throughout the day, the wheezing is worse at night.  Denies fever or chills.  She reports that she starts to cough after talking and laughing a lot.  Mucinex helped relieve her symptoms.  She completed the full antibiotic regimen for her previous URI.  She is also concerned about her right index finger that she "jammed" on Monday.  She was putting a piece of wood in her stove and it fell on her finger.  She can still move her finger, but it hurts and is stiff and swollen.  Past Medical History:  Diagnosis Date  . Hypertension   . Thyroid disease    Past Surgical History:  Procedure Laterality Date  . ABDOMINAL HYSTERECTOMY    . CESAREAN SECTION    . ELBOW SURGERY     No hospitalizations, accidents, or injuries in the past three months.  Allergies: year round outdoor allergies; worse in the fall.  Current Outpatient Medications on File Prior to Visit  Medication Sig Dispense Refill  . levothyroxine (SYNTHROID, LEVOTHROID) 75 MCG tablet Take 1 tablet (75 mcg total) by mouth daily. 30 tablet 11  . triamterene-hydrochlorothiazide (MAXZIDE-25) 37.5-25 MG tablet Take 1 tablet by mouth daily. 30 tablet 5   No current facility-administered medications on file prior to visit.    Family History: Uncle: throat CA, stroke Mother: HTN Father: HTN, heart disease, MI, DM  Immunization History  Administered Date(s) Administered  . Influenza,inj,Quad PF,6+ Mos 04/30/2017  . Pneumococcal Polysaccharide-23  04/30/2017   Social history: She is retired, likes to play with grandchildren, go to church, and hang out with friends.  She tries to eat healthy but has been eating more since her son moved in.  She goes walking at the [facility] for thirty minutes five times a week.  She has never smoked or used smokeless tobacco. She does not drink any alcohol.  Currently has no sexual partners.  Has not traveled outside the [place] in the past 6 months.  Her son and his girlfriend live with her.  LMP: history of a hysterectomy   Review of Systems  Constitutional: Negative for activity change, appetite change, chills, fatigue and fever.  HENT: Positive for congestion, postnasal drip and rhinorrhea. Negative for sinus pressure, sinus pain, sore throat and trouble swallowing.   Respiratory: Positive for cough and wheezing. Negative for chest tightness and shortness of breath.   Cardiovascular: Negative for chest pain.  Gastrointestinal: Positive for diarrhea. Negative for abdominal pain, blood in stool, constipation, nausea and vomiting.       Had diarrhea while on amoxacillin which has resolved.  Genitourinary: Negative for difficulty urinating, dysuria, frequency, genital sores, hematuria, pelvic pain, urgency, vaginal bleeding, vaginal discharge and vaginal pain.  Psychiatric/Behavioral:       Reports that sometimes when she is driving she forgets where she is, cannot recognize where she is, and does not know what is going on.  This lasts for about 15-30 seconds, and then she remembers.  This has happened three times.  Once she had to pull over.  No other memory issues that she is aware of.  Her granddaughter has told her that she repeats herself sometimes.      Objective:   Physical Exam  Constitutional: She is cooperative. No distress.  HENT:  Brown, hard ear wax bilaterally.  TM partially obscured bilaterally. TM pearly gray.  Neck: Normal range of motion. Neck supple. No thyroid mass and no thyromegaly present.    Cardiovascular: Normal rate, regular rhythm, S1 normal, S2 normal and normal heart sounds.  Pulmonary/Chest: No accessory muscle usage. No respiratory distress. She exhibits no mass and no tenderness. Right breast exhibits no inverted nipple, no mass, no nipple discharge and no tenderness. Left breast exhibits no inverted nipple, no mass, no nipple discharge and no tenderness. Breasts are symmetrical.  Rare expiratory wheeze. Scattered expiratory crackles. No tachypnea. Occasional congested cough especially with laughing.  Scattered skin tags on chest; Scattered seborrheic keratosis on chest and under breasts.  Abdominal: Soft. She exhibits no distension and no mass. There is no tenderness.  Genitourinary: There is no rash, tenderness or lesion on the right labia. There is no rash, tenderness or lesion on the left labia. Cervix exhibits discharge. Cervix exhibits no motion tenderness and no friability. No erythema, tenderness or bleeding in the vagina. No foreign body in the vagina. No vaginal discharge found.  Genitourinary Comments: white mucus discharge around cervix;   Musculoskeletal:  Mild kyphosis present. Mild edema and dark pink discoloration noted at the distal part of the right small finger. Very tender to palpation. Dark blood noted under the nailbed.   Neurological: She is alert.  Mini-cog exam normal.  Skin: Skin is warm and intact. No rash noted.  Psychiatric: She has a normal mood and affect. Her speech is normal and behavior is normal. Judgment and thought content normal.   Mini-Cog - 04/30/17 0922    Normal clock drawing test?  yes    How many words correct?  2       Blood pressure 132/84, height 5\' 4"  (1.626 m), weight 199 lb 12.8 oz (90.6 kg). Body mass index is 34.3 kg/m.     Assessment & Plan:   Problem List Items Addressed This Visit    None    Visit Diagnoses    Well woman exam    -  Primary   Relevant Orders   Pap IG and HPV (high risk) DNA detection   Need  for vaccination       Relevant Orders   Flu Vaccine QUAD 36+ mos IM (Completed)   Pneumococcal polysaccharide vaccine 23-valent greater than or equal to 2yo subcutaneous/IM (Completed)   Screening for cervical cancer       Relevant Orders   Pap IG and HPV (high risk) DNA detection   Screening for HPV (human papillomavirus)       Relevant Orders   Pap IG and HPV (high risk) DNA detection   Acute bacterial bronchitis       Relevant Medications   azithromycin (ZITHROMAX Z-PAK) 250 MG tablet   Screening for malignant neoplasm of colon       Relevant Orders   IFOBT POC (occult bld, rslt in office)   Encounter for screening mammogram for breast cancer       Relevant Orders   MM DIGITAL SCREENING BILATERAL   Post-menopausal       Relevant Orders   DG Bone Density   Finger pain, right  rule out fracture   Relevant Orders   DG Finger Little Right     Education: Demonstrated how to use her albuterol inhaler.  Use inhaler before exercise, before bed, and as needed.  Taught her to soften her ear wax, mix equal parts water and hydrogen peroxide and insert drops in each ear.  Use entire course of antibiotics as prescribed; do not stop taking them once you feel better. Warning signs reviewed. OTC meds as directed for cough and congestion. If you start to feel worse or do not feel better by the beginning of next week call the office.  Encouraged healthy diet, weight loss, and exercise.  Recommend daily vitamin D and calcium. Went over health maintenance items and scheduled diagnostic tests. Recommend colonoscopy but patient defers; agrees to iFOBT as alternative. No family history of colon cancer. Has has a normal colonoscopy in 2005.  Meds ordered this encounter  Medications  . azithromycin (ZITHROMAX Z-PAK) 250 MG tablet    Sig: Take 2 tablets (500 mg) on  Day 1,  followed by 1 tablet (250 mg) once daily on Days 2 through 5.    Dispense:  6 each    Refill:  0    Order Specific Question:    Supervising Provider    Answer:   Merlyn AlbertLUKING, WILLIAM S [[id number]]  . albuterol (PROVENTIL HFA;VENTOLIN HFA) 108 (90 Base) MCG/ACT inhaler    Sig: Inhale 2 puffs into the lungs every 4 (four) hours as needed.    Dispense:  1 Inhaler    Refill:  0    Order Specific Question:   Supervising Provider    Answer:   Merlyn AlbertLUKING, WILLIAM S [[id number]]   Return in about 1 year (around 05/01/2018) for wellness exam.

## 2017-05-01 ENCOUNTER — Encounter: Payer: Self-pay | Admitting: Nurse Practitioner

## 2017-05-02 ENCOUNTER — Other Ambulatory Visit: Payer: Self-pay

## 2017-05-02 DIAGNOSIS — Z1211 Encounter for screening for malignant neoplasm of colon: Secondary | ICD-10-CM

## 2017-05-02 LAB — IFOBT (OCCULT BLOOD): IFOBT: NEGATIVE

## 2017-05-03 LAB — PAP IG AND HPV HIGH-RISK
HPV, high-risk: NEGATIVE
PAP SMEAR COMMENT: 0

## 2017-05-08 ENCOUNTER — Ambulatory Visit (HOSPITAL_COMMUNITY)
Admission: RE | Admit: 2017-05-08 | Discharge: 2017-05-08 | Disposition: A | Discharge status: Home / Self Care | Payer: Medicare HMO | Source: Ambulatory Visit | Attending: Nurse Practitioner | Admitting: Nurse Practitioner

## 2017-05-08 DIAGNOSIS — Z1231 Encounter for screening mammogram for malignant neoplasm of breast: Secondary | ICD-10-CM | POA: Diagnosis not present

## 2017-05-08 DIAGNOSIS — M85852 Other specified disorders of bone density and structure, left thigh: Secondary | ICD-10-CM | POA: Diagnosis not present

## 2017-05-08 DIAGNOSIS — Z13820 Encounter for screening for osteoporosis: Secondary | ICD-10-CM | POA: Insufficient documentation

## 2017-05-08 DIAGNOSIS — Z780 Asymptomatic menopausal state: Secondary | ICD-10-CM | POA: Diagnosis not present

## 2017-05-26 ENCOUNTER — Encounter: Payer: Self-pay | Admitting: Nurse Practitioner

## 2017-05-26 DIAGNOSIS — M8580 Other specified disorders of bone density and structure, unspecified site: Secondary | ICD-10-CM | POA: Insufficient documentation

## 2017-06-20 DIAGNOSIS — Z0100 Encounter for examination of eyes and vision without abnormal findings: Secondary | ICD-10-CM | POA: Diagnosis not present

## 2017-06-20 DIAGNOSIS — I10 Essential (primary) hypertension: Secondary | ICD-10-CM | POA: Diagnosis not present

## 2017-10-31 ENCOUNTER — Other Ambulatory Visit: Payer: Self-pay | Admitting: *Deleted

## 2017-10-31 MED ORDER — TRIAMTERENE-HCTZ 37.5-25 MG PO TABS: 1.0000 | ORAL_TABLET | Freq: Every day | ORAL | 0 refills | Status: DC

## 2017-12-02 DIAGNOSIS — R69 Illness, unspecified: Secondary | ICD-10-CM | POA: Diagnosis not present

## 2017-12-03 ENCOUNTER — Other Ambulatory Visit: Payer: Self-pay | Admitting: *Deleted

## 2017-12-03 MED ORDER — LEVOTHYROXINE SODIUM 75 MCG PO TABS: 75.0000 ug | ORAL_TABLET | Freq: Every day | ORAL | 0 refills | Status: DC

## 2018-01-05 ENCOUNTER — Other Ambulatory Visit: Payer: Self-pay | Admitting: Family Medicine

## 2018-01-28 ENCOUNTER — Ambulatory Visit (INDEPENDENT_AMBULATORY_CARE_PROVIDER_SITE_OTHER): Payer: Medicare HMO

## 2018-01-28 ENCOUNTER — Other Ambulatory Visit: Payer: Self-pay | Admitting: Family Medicine

## 2018-01-28 DIAGNOSIS — Z23 Encounter for immunization: Secondary | ICD-10-CM | POA: Diagnosis not present

## 2018-02-13 ENCOUNTER — Ambulatory Visit (HOSPITAL_COMMUNITY)
Admission: RE | Admit: 2018-02-13 | Discharge: 2018-02-13 | Disposition: A | Discharge status: Home / Self Care | Payer: Medicare HMO | Source: Ambulatory Visit | Attending: Family Medicine | Admitting: Family Medicine

## 2018-02-13 ENCOUNTER — Ambulatory Visit (INDEPENDENT_AMBULATORY_CARE_PROVIDER_SITE_OTHER): Payer: Medicare HMO | Admitting: Family Medicine

## 2018-02-13 VITALS — BP 130/80 | Ht 64.0 in | Wt 196.2 lb

## 2018-02-13 DIAGNOSIS — E039 Hypothyroidism, unspecified: Secondary | ICD-10-CM | POA: Diagnosis not present

## 2018-02-13 DIAGNOSIS — R700 Elevated erythrocyte sedimentation rate: Secondary | ICD-10-CM

## 2018-02-13 DIAGNOSIS — M19041 Primary osteoarthritis, right hand: Secondary | ICD-10-CM | POA: Diagnosis not present

## 2018-02-13 DIAGNOSIS — I10 Essential (primary) hypertension: Secondary | ICD-10-CM | POA: Diagnosis not present

## 2018-02-13 DIAGNOSIS — M79641 Pain in right hand: Secondary | ICD-10-CM

## 2018-02-13 MED ORDER — LEVOTHYROXINE SODIUM 75 MCG PO TABS: 75.0000 ug | ORAL_TABLET | Freq: Every day | ORAL | 1 refills | Status: DC

## 2018-02-13 MED ORDER — TRIAMTERENE-HCTZ 37.5-25 MG PO TABS: ORAL_TABLET | ORAL | 5 refills | Status: DC

## 2018-02-13 MED ORDER — TRIAMTERENE-HCTZ 37.5-25 MG PO TABS: ORAL_TABLET | ORAL | 1 refills | Status: DC

## 2018-02-13 NOTE — Progress Notes (Signed)
   Subjective:    Patient ID: Brittany Diaz, female    DOB: Dec 22, 1950, 68 y.o.   MRN: [id number]  HPI  Patient arrives with complaint of arthritis pain in her fingers and hands for a long time. Patient interested in something to help the constant.    Patient also stated she gets dizzy at times if she moves too fast.    Blood pressure medicine and blood pressure levels reviewed today with patient. Compliant with blood pressure medicine. States does not miss a dose. No obvious side effects. Blood pressure generally good when checked elsewhere. Watching salt intake.   Patient takes thyroid medicine faithfully.  Does not miss a dose.  No symptoms of high or low thyroid at this time.  Has not had blood work in some time.    Pain has had trouble with hand painf for awhile    Review of Systems No headache, no major weight loss or weight gain, no chest pain no back pain abdominal pain no change in bowel habits complete ROS otherwise negative     Objective:   Physical Exam  Alert and oriented, vitals reviewed and stable, NAD ENT-TM's and ext canals WNL bilat via otoscopic exam Soft palate, tonsils and post pharynx WNL via oropharyngeal exam Neck-symmetric, no masses; thyroid nonpalpable and nontender Pulmonary-no tachypnea or accessory muscle use; Clear without wheezes via auscultation Card--no abnrml murmurs, rhythm reg and rate WNL Carotid pulses symmetric, without bruits Hands grip intact bilateral.  Some potential Heberden's nodes.  Question mild swelling at the metacarpal phalangeal joint      Assessment & Plan:  Impression 1 hypertension good control discussed maintain same meds  2. hypothyroidism prior blood work reviewed.  Compliance discussed maintain  3.  Progressive hand pain worse in the morning.  Some features which raise concern about potential more substantial arthritis.  We will do some blood work and x-rays and further recommendations based on results

## 2018-02-14 LAB — BASIC METABOLIC PANEL
BUN/Creatinine Ratio: 13 (ref 12–28)
BUN: 13 mg/dL (ref 8–27)
CO2: 19 mmol/L — ABNORMAL LOW (ref 20–29)
Calcium: 9.9 mg/dL (ref 8.7–10.3)
Chloride: 100 mmol/L (ref 96–106)
Creatinine, Ser: 1 mg/dL (ref 0.57–1.00)
GFR calc Af Amer: 68 mL/min/{1.73_m2} (ref >59)
GFR calc non Af Amer: 59 mL/min/{1.73_m2} — ABNORMAL LOW (ref >59)
Glucose: 97 mg/dL (ref 65–99)
Potassium: 4.2 mmol/L (ref 3.5–5.2)
Sodium: 140 mmol/L (ref 134–144)

## 2018-02-14 LAB — HEPATIC FUNCTION PANEL
ALT: 17 IU/L (ref 0–32)
AST: 18 IU/L (ref 0–40)
Albumin: 4.5 g/dL (ref 3.6–4.8)
Alkaline Phosphatase: 93 IU/L (ref 39–117)
Bilirubin Total: 0.2 mg/dL (ref 0.0–1.2)
Bilirubin, Direct: 0.07 mg/dL (ref 0.00–0.40)
Total Protein: 7.5 g/dL (ref 6.0–8.5)

## 2018-02-14 LAB — RHEUMATOID FACTOR: Rheumatoid fact SerPl-aCnc: 24 IU/mL — ABNORMAL HIGH (ref 0.0–13.9)

## 2018-02-14 LAB — LIPID PANEL
Chol/HDL Ratio: 4.1 ratio (ref 0.0–4.4)
Cholesterol, Total: 200 mg/dL — ABNORMAL HIGH (ref 100–199)
HDL: 49 mg/dL (ref >39)
LDL Calculated: 128 mg/dL — ABNORMAL HIGH (ref 0–99)
Triglycerides: 117 mg/dL (ref 0–149)
VLDL Cholesterol Cal: 23 mg/dL (ref 5–40)

## 2018-02-14 LAB — SEDIMENTATION RATE: Sed Rate: 80 mm/hr — ABNORMAL HIGH (ref 0–40)

## 2018-02-14 LAB — TSH: TSH: 1.92 u[IU]/mL (ref 0.450–4.500)

## 2018-02-18 ENCOUNTER — Encounter: Payer: Self-pay | Admitting: Family Medicine

## 2018-02-18 NOTE — Addendum Note (Signed)
Addended by: Margaretha Sheffield on: 02/18/2018 09:40 AM   Modules accepted: Orders

## 2018-03-25 ENCOUNTER — Ambulatory Visit: Payer: Medicare HMO | Admitting: Rheumatology

## 2018-04-14 ENCOUNTER — Ambulatory Visit: Payer: Medicare HMO | Admitting: Rheumatology

## 2018-06-12 ENCOUNTER — Other Ambulatory Visit (HOSPITAL_COMMUNITY): Payer: Self-pay | Admitting: Family Medicine

## 2018-06-12 DIAGNOSIS — Z1231 Encounter for screening mammogram for malignant neoplasm of breast: Secondary | ICD-10-CM

## 2018-07-01 ENCOUNTER — Other Ambulatory Visit: Payer: Self-pay

## 2018-07-01 ENCOUNTER — Ambulatory Visit (HOSPITAL_COMMUNITY)
Admission: RE | Admit: 2018-07-01 | Discharge: 2018-07-01 | Disposition: A | Discharge status: Home / Self Care | Payer: Medicare HMO | Source: Ambulatory Visit | Attending: Family Medicine | Admitting: Family Medicine

## 2018-07-01 ENCOUNTER — Other Ambulatory Visit: Payer: Self-pay | Admitting: Family Medicine

## 2018-07-01 DIAGNOSIS — Z1231 Encounter for screening mammogram for malignant neoplasm of breast: Secondary | ICD-10-CM | POA: Diagnosis not present

## 2018-08-10 ENCOUNTER — Ambulatory Visit: Payer: Medicare HMO | Admitting: Family Medicine

## 2018-08-21 ENCOUNTER — Other Ambulatory Visit: Payer: Self-pay | Admitting: Family Medicine

## 2018-09-04 ENCOUNTER — Other Ambulatory Visit: Payer: Self-pay | Admitting: Family Medicine

## 2018-09-05 NOTE — Telephone Encounter (Signed)
Schedule virtual visit follow-up with Dr. Richardson Landry may have 1 refill

## 2018-09-24 ENCOUNTER — Ambulatory Visit (INDEPENDENT_AMBULATORY_CARE_PROVIDER_SITE_OTHER): Payer: Medicare HMO | Admitting: Family Medicine

## 2018-09-24 ENCOUNTER — Other Ambulatory Visit: Payer: Self-pay

## 2018-09-24 ENCOUNTER — Encounter: Payer: Self-pay | Admitting: Family Medicine

## 2018-09-24 VITALS — BP 138/84 | Temp 97.7°F | Ht 62.5 in | Wt 202.0 lb

## 2018-09-24 DIAGNOSIS — I10 Essential (primary) hypertension: Secondary | ICD-10-CM | POA: Diagnosis not present

## 2018-09-24 DIAGNOSIS — Z23 Encounter for immunization: Secondary | ICD-10-CM

## 2018-09-24 DIAGNOSIS — Z0000 Encounter for general adult medical examination without abnormal findings: Secondary | ICD-10-CM | POA: Diagnosis not present

## 2018-09-24 DIAGNOSIS — R42 Dizziness and giddiness: Secondary | ICD-10-CM | POA: Diagnosis not present

## 2018-09-24 DIAGNOSIS — Z1211 Encounter for screening for malignant neoplasm of colon: Secondary | ICD-10-CM

## 2018-09-24 MED ORDER — TRIAMTERENE-HCTZ 37.5-25 MG PO TABS: 1.0000 | ORAL_TABLET | Freq: Every day | ORAL | 1 refills | Status: DC

## 2018-09-24 MED ORDER — LEVOTHYROXINE SODIUM 75 MCG PO TABS: 75.0000 ug | ORAL_TABLET | Freq: Every day | ORAL | 1 refills | Status: DC

## 2018-09-24 NOTE — Progress Notes (Signed)
Subjective:    Patient ID: Brittany Diaz, female    DOB: June 10, 1950, 68 y.o.   MRN: [id number]  HPI AWV- Annual Wellness Visit  The patient was seen for their annual wellness visit. The patient's past medical history, surgical history, and family history were reviewed. Pertinent vaccines were reviewed ( tetanus, pneumonia, shingles, flu) The patient's medication list was reviewed and updated.  The height and weight were entered.  BMI recorded in electronic record elsewhere  Cognitive screening was completed. Outcome of Mini - Cog: pass   Falls /depression screening electronically recorded within record elsewhere  Current tobacco usage: none (All patients who use tobacco were given written and verbal information on quitting)  Recent listing of emergency department/hospitalizations over the past year were reviewed.  current specialist the patient sees on a regular basis: none   Medicare annual wellness visit patient questionnaire was reviewed.  A written screening schedule for the patient for the next 5-10 years was given. Appropriate discussion of followup regarding next visit was discussed.  Concerns about being forgetful. MMSE 28/30  About 2 weeks was driving and when she was at the stop light she felt like the car was still moving and later the same day she was going down the steps and felt like she was moving when she was standing still. Has not happened since that day.   Blood pressure medicine and blood pressure levels reviewed today with patient. Compliant with blood pressure medicine. States does not miss a dose. No obvious side effects. Blood pressure generally good when checked elsewhere. Watching salt intake.     Review of Systems  Constitutional: Negative for activity change, appetite change and fatigue.  HENT: Negative for congestion and rhinorrhea.   Eyes: Negative for discharge.  Respiratory: Negative for cough, chest tightness and wheezing.    Cardiovascular: Negative for chest pain.  Gastrointestinal: Negative for abdominal pain, blood in stool and vomiting.  Endocrine: Negative for polyphagia.  Genitourinary: Negative for difficulty urinating and frequency.  Musculoskeletal: Negative for neck pain.  Skin: Negative for color change.  Allergic/Immunologic: Negative for environmental allergies and food allergies.  Neurological: Negative for weakness and headaches.  Psychiatric/Behavioral: Negative for agitation and behavioral problems.  All other systems reviewed and are negative.      Objective:   Physical Exam Vitals signs reviewed.  Constitutional:      Appearance: She is well-developed.  HENT:     Head: Normocephalic.     Right Ear: External ear normal.     Left Ear: External ear normal.  Eyes:     Pupils: Pupils are equal, round, and reactive to light.  Neck:     Musculoskeletal: Normal range of motion.     Thyroid: No thyromegaly.  Cardiovascular:     Rate and Rhythm: Normal rate and regular rhythm.     Heart sounds: Normal heart sounds. No murmur.  Pulmonary:     Effort: Pulmonary effort is normal. No respiratory distress.     Breath sounds: Normal breath sounds. No wheezing.  Abdominal:     General: Bowel sounds are normal. There is no distension.     Palpations: Abdomen is soft. There is no mass.     Tenderness: There is no abdominal tenderness.  Musculoskeletal: Normal range of motion.        General: No tenderness.  Lymphadenopathy:     Cervical: No cervical adenopathy.  Skin:    General: Skin is warm and dry.  Neurological:  Mental Status: She is alert and oriented to person, place, and time.     Motor: No abnormal muscle tone.  Psychiatric:        Behavior: Behavior normal.           Assessment & Plan:  Impression well adult exam.  Diet discussed.  Exercise discussed.  Up-to-date on mammography.  Patient wishes only at 5 OBT study for colon cancer.  Will comply..  Pneumonia vaccine  today.  2.  Hypertension.  Blood pressure good on repeat to maintain same meds side effects benefits discussed compliance encouraged  3.  Momentary unsteadiness x2.  Concerning to patient.  Generally occurring with change of head position.  High likelihood of vertigo in nature.  Do not recommend major neurology work-up at this time  Follow-up in 6 months diet exercise discussed

## 2018-09-29 ENCOUNTER — Other Ambulatory Visit: Payer: Self-pay

## 2018-09-29 DIAGNOSIS — Z1211 Encounter for screening for malignant neoplasm of colon: Secondary | ICD-10-CM

## 2018-09-29 LAB — IFOBT (OCCULT BLOOD): IFOBT: NEGATIVE

## 2018-11-16 ENCOUNTER — Other Ambulatory Visit: Payer: Self-pay | Admitting: Family Medicine

## 2018-12-28 ENCOUNTER — Other Ambulatory Visit: Payer: Self-pay | Admitting: Family Medicine

## 2019-03-18 ENCOUNTER — Encounter: Payer: Self-pay | Admitting: Family Medicine

## 2019-03-29 ENCOUNTER — Encounter: Payer: Self-pay | Admitting: Family Medicine

## 2019-03-29 ENCOUNTER — Ambulatory Visit (INDEPENDENT_AMBULATORY_CARE_PROVIDER_SITE_OTHER): Payer: Medicare HMO | Admitting: Family Medicine

## 2019-03-29 ENCOUNTER — Other Ambulatory Visit: Payer: Self-pay

## 2019-03-29 VITALS — BP 146/84 | HR 64 | Ht 62.5 in | Wt 199.0 lb

## 2019-03-29 DIAGNOSIS — I10 Essential (primary) hypertension: Secondary | ICD-10-CM

## 2019-03-29 DIAGNOSIS — Z79899 Other long term (current) drug therapy: Secondary | ICD-10-CM | POA: Diagnosis not present

## 2019-03-29 DIAGNOSIS — E039 Hypothyroidism, unspecified: Secondary | ICD-10-CM | POA: Diagnosis not present

## 2019-03-29 DIAGNOSIS — Z13220 Encounter for screening for lipoid disorders: Secondary | ICD-10-CM

## 2019-03-29 NOTE — Progress Notes (Signed)
   Subjective:  Audio patient connects with multiple concerns  Patient ID: Brittany Diaz, female    DOB: Feb 05, 1951, 69 y.o.   MRN: [id number]  Hypertension This is a chronic problem. Treatments tried: maxzide. Compliance problems: takes med 99 percent of the time, 30 minute workout every day.    Pt is wanting a bloodtest to check for cancer. Her daughter recently diagnosed with cancer.  Patient does not know exact diagnosis for her daughter.  Would like to get her bone density test scheduled. Due after march 28.  Prior bone density test revealed osteopenia  Virtual Visit via Telephone Note  I connected with ERABELLA KUIPERS on 03/29/19 at  8:30 AM EST by telephone and verified that I am speaking with the correct person using two identifiers.  Location: Patient: home Provider: office   I discussed the limitations, risks, security and privacy concerns of performing an evaluation and management service by telephone and the availability of in person appointments. I also discussed with the patient that there may be a patient responsible charge related to this service. The patient expressed understanding and agreed to proceed.   History of Present Illness:    Observations/Objective:   Assessment and Plan:   Follow Up Instructions:    I discussed the assessment and treatment plan with the patient. The patient was provided an opportunity to ask questions and all were answered. The patient agreed with the plan and demonstrated an understanding of the instructions.   The patient was advised to call back or seek an in-person evaluation if the symptoms worsen or if the condition fails to improve as anticipated.  I provided 25 minutes of non-face-to-face time during this encounter.  Vitamin d gummies, pt walking   Blood pressure medicine and blood pressure levels reviewed today with patient. Compliant with blood pressure medicine. States does not miss a dose. No obvious side effects.  Blood pressure generally good when checked elsewhere. Watching salt intake.  Patient compliant with thyroid.  Some fatigue at times.  No other symptoms of high or low thyroid.  Blood work has not been obtained   Review of Systems No headache, no major weight loss or weight gain, no chest pain no back pain abdominal pain no change in bowel habits complete ROS otherwise negative     Objective:   Physical Exam  Virtual      Assessment & Plan:  Impression 1 hypertension.  Good control discussed maintain same meds  2.  Hypothyroidism status uncertain.  Blood work to assess rationale discussed  3.  Osteopenia.  Instructed to call after March and we can set up another bone density test  4.  Cancer concerns.  Daughter has a new metastatic cancer diagnosis but patient is on aware of the primary source.  First I expressed my condolences regarding her daughter's serious illness, and asked her to try to find the source we can better answer her concerns in the future patient is up-to-date on mammogram though declined colonoscopy want with [facility] team last opportunity

## 2019-03-30 DIAGNOSIS — Z79899 Other long term (current) drug therapy: Secondary | ICD-10-CM | POA: Diagnosis not present

## 2019-03-30 DIAGNOSIS — I10 Essential (primary) hypertension: Secondary | ICD-10-CM | POA: Diagnosis not present

## 2019-03-30 DIAGNOSIS — E039 Hypothyroidism, unspecified: Secondary | ICD-10-CM | POA: Diagnosis not present

## 2019-03-30 DIAGNOSIS — Z13220 Encounter for screening for lipoid disorders: Secondary | ICD-10-CM | POA: Diagnosis not present

## 2019-03-31 ENCOUNTER — Other Ambulatory Visit: Payer: Self-pay | Admitting: Family Medicine

## 2019-03-31 LAB — HEPATIC FUNCTION PANEL
ALT: 14 IU/L (ref 0–32)
AST: 19 IU/L (ref 0–40)
Albumin: 4.4 g/dL (ref 3.8–4.8)
Alkaline Phosphatase: 107 IU/L (ref 39–117)
Bilirubin Total: 0.2 mg/dL (ref 0.0–1.2)
Bilirubin, Direct: 0.08 mg/dL (ref 0.00–0.40)
Total Protein: 7.5 g/dL (ref 6.0–8.5)

## 2019-03-31 LAB — LIPID PANEL
Chol/HDL Ratio: 3.5 ratio (ref 0.0–4.4)
Cholesterol, Total: 169 mg/dL (ref 100–199)
HDL: 48 mg/dL (ref >39)
LDL Chol Calc (NIH): 105 mg/dL — ABNORMAL HIGH (ref 0–99)
Triglycerides: 83 mg/dL (ref 0–149)
VLDL Cholesterol Cal: 16 mg/dL (ref 5–40)

## 2019-03-31 LAB — BASIC METABOLIC PANEL
BUN/Creatinine Ratio: 12 (ref 12–28)
BUN: 12 mg/dL (ref 8–27)
CO2: 23 mmol/L (ref 20–29)
Calcium: 9.5 mg/dL (ref 8.7–10.3)
Chloride: 102 mmol/L (ref 96–106)
Creatinine, Ser: 1.01 mg/dL — ABNORMAL HIGH (ref 0.57–1.00)
GFR calc Af Amer: 67 mL/min/{1.73_m2} (ref >59)
GFR calc non Af Amer: 58 mL/min/{1.73_m2} — ABNORMAL LOW (ref >59)
Glucose: 112 mg/dL — ABNORMAL HIGH (ref 65–99)
Potassium: 4.1 mmol/L (ref 3.5–5.2)
Sodium: 140 mmol/L (ref 134–144)

## 2019-03-31 LAB — CBC WITH DIFFERENTIAL/PLATELET
Basophils Absolute: 0 10*3/uL (ref 0.0–0.2)
Basos: 1 %
EOS (ABSOLUTE): 0.3 10*3/uL (ref 0.0–0.4)
Eos: 4 %
Hematocrit: 40.8 % (ref 34.0–46.6)
Hemoglobin: 13.7 g/dL (ref 11.1–15.9)
Immature Grans (Abs): 0 10*3/uL (ref 0.0–0.1)
Immature Granulocytes: 0 %
Lymphocytes Absolute: 1.8 10*3/uL (ref 0.7–3.1)
Lymphs: 23 %
MCH: 28.1 pg (ref 26.6–33.0)
MCHC: 33.6 g/dL (ref 31.5–35.7)
MCV: 84 fL (ref 79–97)
Monocytes Absolute: 0.5 10*3/uL (ref 0.1–0.9)
Monocytes: 7 %
Neutrophils Absolute: 5 10*3/uL (ref 1.4–7.0)
Neutrophils: 65 %
Platelets: 302 10*3/uL (ref 150–450)
RBC: 4.88 x10E6/uL (ref 3.77–5.28)
RDW: 16.6 % — ABNORMAL HIGH (ref 11.7–15.4)
WBC: 7.6 10*3/uL (ref 3.4–10.8)

## 2019-03-31 LAB — TSH: TSH: 4.92 u[IU]/mL — ABNORMAL HIGH (ref 0.450–4.500)

## 2019-04-05 ENCOUNTER — Other Ambulatory Visit: Payer: Self-pay | Admitting: *Deleted

## 2019-04-05 DIAGNOSIS — E039 Hypothyroidism, unspecified: Secondary | ICD-10-CM

## 2019-04-05 MED ORDER — LEVOTHYROXINE SODIUM 88 MCG PO TABS: 88.0000 ug | ORAL_TABLET | Freq: Every day | ORAL | 1 refills | Status: DC

## 2019-04-22 ENCOUNTER — Ambulatory Visit: Payer: Medicare HMO | Attending: Internal Medicine

## 2019-04-22 DIAGNOSIS — Z23 Encounter for immunization: Secondary | ICD-10-CM

## 2019-04-22 NOTE — Progress Notes (Signed)
   Covid-19 Vaccination Clinic  Name:  RYENN HOWETH    MRN: [id number] DOB: 12-10-1950  04/22/2019  Ms. Vink was observed post Covid-19 immunization for 15 minutes without incident. She was provided with Vaccine Information Sheet and instruction to access the V-Safe system.   Ms. Gigante was instructed to call 911 with any severe reactions post vaccine: Marland Kitchen Difficulty breathing  . Swelling of face and throat  . A fast heartbeat  . A bad rash all over body  . Dizziness and weakness   Immunizations Administered    Name Date Dose VIS Date Route   Moderna COVID-19 Vaccine 04/22/2019 10:10 AM 0.5 mL 01/12/2019 Intramuscular   Manufacturer: Moderna   Lot: [id number]   NDC: 03128-118-86

## 2019-05-25 ENCOUNTER — Ambulatory Visit: Payer: Medicare HMO | Attending: Internal Medicine

## 2019-05-25 DIAGNOSIS — Z23 Encounter for immunization: Secondary | ICD-10-CM

## 2019-05-25 NOTE — Progress Notes (Signed)
   Covid-19 Vaccination Clinic  Name:  Brittany Diaz    MRN: [id number] DOB: March 22, 1950  05/25/2019  [name] was observed post Covid-19 immunization for 15 minutes without incident. She was provided with Vaccine Information Sheet and instruction to access the V-Safe system.   [name] was instructed to call 911 with any severe reactions post vaccine: Marland Kitchen Difficulty breathing  . Swelling of face and throat  . A fast heartbeat  . A bad rash all over body  . Dizziness and weakness   Immunizations Administered    Name Date Dose VIS Date Route   Moderna COVID-19 Vaccine 05/25/2019  8:27 AM 0.5 mL 01/12/2019 Intramuscular   Manufacturer: Moderna   Lot: [id number]   NDC: 82060-156-15

## 2019-05-26 ENCOUNTER — Telehealth: Payer: Self-pay | Admitting: Family Medicine

## 2019-05-26 NOTE — Telephone Encounter (Signed)
Should fade within 7 d no treatment, tell her congrats on getting a vaccine that may well save her life

## 2019-05-26 NOTE — Telephone Encounter (Signed)
Patient notified of Dr. Soyla Dryer recommendation.

## 2019-05-26 NOTE — Telephone Encounter (Signed)
Pt got 2nd covid shot yesterday and her ear has been ringing since and wants to know what she needs to do.

## 2019-06-07 ENCOUNTER — Other Ambulatory Visit: Payer: Self-pay | Admitting: Family Medicine

## 2019-06-28 ENCOUNTER — Other Ambulatory Visit: Payer: Self-pay

## 2019-06-28 MED ORDER — LEVOTHYROXINE SODIUM 88 MCG PO TABS: 88.0000 ug | ORAL_TABLET | Freq: Every day | ORAL | 1 refills | Status: DC

## 2019-07-30 ENCOUNTER — Ambulatory Visit: Payer: Medicare HMO | Admitting: Nurse Practitioner

## 2019-07-30 ENCOUNTER — Other Ambulatory Visit (HOSPITAL_COMMUNITY): Payer: Self-pay | Admitting: Nurse Practitioner

## 2019-07-30 DIAGNOSIS — Z1231 Encounter for screening mammogram for malignant neoplasm of breast: Secondary | ICD-10-CM

## 2019-08-09 ENCOUNTER — Ambulatory Visit (HOSPITAL_COMMUNITY)
Admission: RE | Admit: 2019-08-09 | Discharge: 2019-08-09 | Disposition: A | Discharge status: Home / Self Care | Payer: Medicare HMO | Source: Ambulatory Visit | Attending: Nurse Practitioner | Admitting: Nurse Practitioner

## 2019-08-09 ENCOUNTER — Other Ambulatory Visit: Payer: Self-pay

## 2019-08-09 DIAGNOSIS — Z1231 Encounter for screening mammogram for malignant neoplasm of breast: Secondary | ICD-10-CM

## 2019-08-16 ENCOUNTER — Other Ambulatory Visit: Payer: Self-pay | Admitting: Family Medicine

## 2019-08-23 ENCOUNTER — Ambulatory Visit (INDEPENDENT_AMBULATORY_CARE_PROVIDER_SITE_OTHER): Payer: Medicare HMO | Admitting: Family Medicine

## 2019-08-23 ENCOUNTER — Other Ambulatory Visit: Payer: Self-pay

## 2019-08-23 ENCOUNTER — Encounter: Payer: Self-pay | Admitting: Family Medicine

## 2019-08-23 VITALS — BP 133/82 | HR 73 | Temp 97.1°F | Ht 62.5 in | Wt 195.2 lb

## 2019-08-23 DIAGNOSIS — Z13820 Encounter for screening for osteoporosis: Secondary | ICD-10-CM | POA: Diagnosis not present

## 2019-08-23 DIAGNOSIS — R937 Abnormal findings on diagnostic imaging of other parts of musculoskeletal system: Secondary | ICD-10-CM | POA: Diagnosis not present

## 2019-08-23 DIAGNOSIS — M79632 Pain in left forearm: Secondary | ICD-10-CM

## 2019-08-23 DIAGNOSIS — E039 Hypothyroidism, unspecified: Secondary | ICD-10-CM

## 2019-08-23 DIAGNOSIS — M19041 Primary osteoarthritis, right hand: Secondary | ICD-10-CM | POA: Diagnosis not present

## 2019-08-23 MED ORDER — DICLOFENAC SODIUM 1 % EX GEL: 2.0000 g | Freq: Four times a day (QID) | CUTANEOUS | 1 refills | Status: AC | PRN

## 2019-08-23 NOTE — Progress Notes (Signed)
     Patient ID: Brittany Diaz, female    DOB: 09-13-1950, 69 y.o.   MRN: [id number]   Chief Complaint  Patient presents with  . Arm Pain   Subjective:    HPI CC- left arm pain. Arm pain began about 2 days ago. Pt states the pain starts at shoulder and goes to fingers. Pt states she has not stressed her arm, no injuries. Pt has been using Red Oil,Tylenol and massage to help with pain. That does help some.   Pt has some knots in joint area of right hand. Pt has been massaging hand and that does seem to help some. Right hand pain makes it difficult for patient to sleep. Right hand locks up and goes numb at times also.  No new trauma to the hand or fingers.    xray hand- 1/20- showing diffuse deg changes.   Medical History Jaleeya has a past medical history of Hypertension and Thyroid disease.   Outpatient Encounter Medications as of 08/23/2019  Medication Sig  . albuterol (PROVENTIL HFA;VENTOLIN HFA) 108 (90 Base) MCG/ACT inhaler Inhale 2 puffs into the lungs every 4 (four) hours as needed.  . triamterene-hydrochlorothiazide (MAXZIDE-25) 37.5-25 MG tablet TAKE 1 TABLET EVERY DAY  . [DISCONTINUED] levothyroxine (SYNTHROID) 88 MCG tablet Take 1 tablet (88 mcg total) by mouth daily.  . diclofenac Sodium (VOLTAREN) 1 % GEL Apply 2 g topically 4 (four) times daily as needed. For right hand pain prn.   No facility-administered encounter medications on file as of 08/23/2019.     Review of Systems  Musculoskeletal:       +rt hand and left arm pain.  Skin: Negative for rash.  Neurological: Positive for numbness. Negative for weakness.    Vitals BP 133/82   Pulse 73   Temp (!) 97.1 F (36.2 C)   Ht 5' 2.5" (1.588 m)   Wt 195 lb 3.2 oz (88.5 kg)   SpO2 97%   BMI 35.13 kg/m   Objective:   Physical Exam Musculoskeletal:     Comments: +normal rom of rt arm.  ttp over the rt forearm. +nodules on the pip and dip joints of 4th digit of the rt hand. No swelling, erythema, or  warmth.     Assessment and Plan   1. Arthritis of right hand - diclofenac Sodium (VOLTAREN) 1 % GEL; Apply 2 g topically 4 (four) times daily as needed. For right hand pain prn.  Dispense: 50 g; Refill: 1  2. Hypothyroidism, unspecified type - TSH  3. Abnormal bone density screening  4. Screening for osteoporosis - DG Bone Density; Future  5. Pain of left forearm   Hypothyroid- Pt stating hasn't started taking and still taking the .  Was changed on her last visit. Ordered tsh today, pt to get it today.   arthritis of rt hand/ left forearm pain- gave diclofenac gel to use prn.  Tylenol prn.   F/u prn.

## 2019-08-24 ENCOUNTER — Other Ambulatory Visit: Payer: Self-pay | Admitting: Family Medicine

## 2019-08-24 LAB — TSH: TSH: 1.89 u[IU]/mL (ref 0.450–4.500)

## 2019-08-24 MED ORDER — LEVOTHYROXINE SODIUM 75 MCG PO TABS: 75.0000 ug | ORAL_TABLET | Freq: Every day | ORAL | 1 refills | Status: DC

## 2019-08-31 ENCOUNTER — Other Ambulatory Visit: Payer: Self-pay | Admitting: *Deleted

## 2019-08-31 MED ORDER — LEVOTHYROXINE SODIUM 75 MCG PO TABS: 75.0000 ug | ORAL_TABLET | Freq: Every day | ORAL | 1 refills | Status: DC

## 2019-09-09 ENCOUNTER — Ambulatory Visit (HOSPITAL_COMMUNITY)
Admission: RE | Admit: 2019-09-09 | Discharge: 2019-09-09 | Disposition: A | Discharge status: Home / Self Care | Payer: Medicare HMO | Source: Ambulatory Visit | Attending: Family Medicine | Admitting: Family Medicine

## 2019-09-09 ENCOUNTER — Other Ambulatory Visit: Payer: Self-pay

## 2019-09-09 DIAGNOSIS — R29890 Loss of height: Secondary | ICD-10-CM | POA: Diagnosis not present

## 2019-09-09 DIAGNOSIS — Z780 Asymptomatic menopausal state: Secondary | ICD-10-CM | POA: Insufficient documentation

## 2019-09-09 DIAGNOSIS — M85852 Other specified disorders of bone density and structure, left thigh: Secondary | ICD-10-CM | POA: Diagnosis not present

## 2019-09-09 DIAGNOSIS — E0590 Thyrotoxicosis, unspecified without thyrotoxic crisis or storm: Secondary | ICD-10-CM | POA: Diagnosis not present

## 2019-09-09 DIAGNOSIS — Z13820 Encounter for screening for osteoporosis: Secondary | ICD-10-CM | POA: Insufficient documentation

## 2019-09-16 DIAGNOSIS — H16223 Keratoconjunctivitis sicca, not specified as Sjogren's, bilateral: Secondary | ICD-10-CM | POA: Diagnosis not present

## 2019-09-16 DIAGNOSIS — H5203 Hypermetropia, bilateral: Secondary | ICD-10-CM | POA: Diagnosis not present

## 2019-09-16 DIAGNOSIS — H2513 Age-related nuclear cataract, bilateral: Secondary | ICD-10-CM | POA: Diagnosis not present

## 2019-09-16 DIAGNOSIS — H524 Presbyopia: Secondary | ICD-10-CM | POA: Diagnosis not present

## 2019-09-20 ENCOUNTER — Telehealth: Payer: Self-pay | Admitting: Family Medicine

## 2019-09-20 MED ORDER — LEVOTHYROXINE SODIUM 75 MCG PO TABS: 75.0000 ug | ORAL_TABLET | Freq: Every day | ORAL | 1 refills | Status: DC

## 2019-09-20 NOTE — Telephone Encounter (Signed)
Pt contacted and verbalized understanding.  

## 2019-09-20 NOTE — Telephone Encounter (Signed)
Patient calling because her thyroid medicine was sent to [facility] and it is suppose to go to [facility] mail order pharmacy.  Blood pressure was sent correctly so just needs Levothyroxine 75 mcg sent to [facility].

## 2019-09-20 NOTE — Telephone Encounter (Signed)
Please advise. Thank you

## 2019-10-26 ENCOUNTER — Other Ambulatory Visit: Payer: Self-pay | Admitting: Family Medicine

## 2019-11-23 ENCOUNTER — Ambulatory Visit (INDEPENDENT_AMBULATORY_CARE_PROVIDER_SITE_OTHER): Payer: Medicare HMO | Admitting: Family Medicine

## 2019-11-23 ENCOUNTER — Encounter: Payer: Self-pay | Admitting: Family Medicine

## 2019-11-23 VITALS — BP 126/86 | HR 78 | Temp 97.2°F | Ht 62.5 in | Wt 199.2 lb

## 2019-11-23 DIAGNOSIS — G5602 Carpal tunnel syndrome, left upper limb: Secondary | ICD-10-CM | POA: Diagnosis not present

## 2019-11-23 DIAGNOSIS — E039 Hypothyroidism, unspecified: Secondary | ICD-10-CM

## 2019-11-23 DIAGNOSIS — Z0000 Encounter for general adult medical examination without abnormal findings: Secondary | ICD-10-CM

## 2019-11-23 DIAGNOSIS — Z23 Encounter for immunization: Secondary | ICD-10-CM

## 2019-11-23 DIAGNOSIS — I10 Essential (primary) hypertension: Secondary | ICD-10-CM | POA: Diagnosis not present

## 2019-11-23 MED ORDER — TRIAMTERENE-HCTZ 37.5-25 MG PO TABS
1.0000 | ORAL_TABLET | Freq: Every day | ORAL | 0 refills | Status: DC
Start: 2020-01-23 — End: 2020-04-14

## 2019-11-23 NOTE — Progress Notes (Signed)
Patient ID: Brittany Diaz, female    DOB: 1950-08-14, 69 y.o.   MRN: [id number]   Chief Complaint  Patient presents with  . Medicare Wellness   Subjective:    HPI   AWV- Annual Wellness Visit  The patient was seen for their annual wellness visit. The patient's past medical history, surgical history, and family history were reviewed. Pertinent vaccines were reviewed ( tetanus, pneumonia, shingles, flu) The patient's medication list was reviewed and updated.  The height and weight were entered.  BMI recorded in electronic record elsewhere  Cognitive screening was completed. Outcome of Mini - Cog: pass   Falls /depression screening electronically recorded within record elsewhere  Current tobacco usage:no (All patients who use tobacco were given written and verbal information on quitting)  Recent listing of emergency department/hospitalizations over the past year were reviewed.  current specialist the patient sees on a regular basis: none   Medicare annual wellness visit patient questionnaire was reviewed.  A written screening schedule for the patient for the next 5-10 years was given. Appropriate discussion of followup regarding next visit was discussed.   Concerns-  left forearm improved and the left index finger numbness since last visit 7/21. Numbness in morning after sleeping with the left index finger. Has h/o carpal tunnel in the left hand.  Has the wrist splint but not using it currently. Not wanting to see ortho at this time.    Medical History Brittany Diaz has a past medical history of Hypertension and Thyroid disease.   Outpatient Encounter Medications as of 11/23/2019  Medication Sig  . albuterol (PROVENTIL HFA;VENTOLIN HFA) 108 (90 Base) MCG/ACT inhaler Inhale 2 puffs into the lungs every 4 (four) hours as needed.  . diclofenac Sodium (VOLTAREN) 1 % GEL Apply 2 g topically 4 (four) times daily as needed. For right hand pain prn.  . levothyroxine (SYNTHROID)  75 MCG tablet Take 1 tablet (75 mcg total) by mouth daily.  Derrill Memo ON 01/23/2020] triamterene-hydrochlorothiazide (MAXZIDE-25) 37.5-25 MG tablet Take 1 tablet by mouth daily.  . [DISCONTINUED] triamterene-hydrochlorothiazide (MAXZIDE-25) 37.5-25 MG tablet TAKE 1 TABLET EVERY DAY   No facility-administered encounter medications on file as of 11/23/2019.   Review of Systems  Constitutional: Negative for chills and fever.  HENT: Negative for congestion, rhinorrhea and sore throat.   Respiratory: Negative for cough, shortness of breath and wheezing.   Cardiovascular: Negative for chest pain and leg swelling.  Gastrointestinal: Negative for abdominal pain, diarrhea, nausea and vomiting.  Genitourinary: Negative for dysuria and frequency.  Musculoskeletal: Negative for arthralgias and back pain.  Skin: Negative for rash.  Neurological: Positive for numbness (left thumb). Negative for dizziness, weakness and headaches.     Vitals BP 126/86   Pulse 78   Temp (!) 97.2 F (36.2 C) (Oral)   Ht 5' 2.5" (1.588 m)   Wt 199 lb 3.2 oz (90.4 kg)   SpO2 99%   BMI 35.85 kg/m   Objective:   Physical Exam Vitals and nursing note reviewed.  Constitutional:      Appearance: Normal appearance.  HENT:     Head: Normocephalic and atraumatic.     Nose: Nose normal.     Mouth/Throat:     Mouth: Mucous membranes are moist.     Pharynx: Oropharynx is clear.  Eyes:     Extraocular Movements: Extraocular movements intact.     Conjunctiva/sclera: Conjunctivae normal.     Pupils: Pupils are equal, round, and reactive to light.  Cardiovascular:  Rate and Rhythm: Normal rate and regular rhythm.     Pulses: Normal pulses.     Heart sounds: Normal heart sounds.  Pulmonary:     Effort: Pulmonary effort is normal.     Breath sounds: Normal breath sounds. No wheezing, rhonchi or rales.  Musculoskeletal:        General: Normal range of motion.     Right lower leg: No edema.     Left lower leg: No  edema.  Skin:    General: Skin is warm and dry.     Findings: No lesion or rash.  Neurological:     General: No focal deficit present.     Mental Status: She is alert and oriented to person, place, and time.  Psychiatric:        Mood and Affect: Mood normal.        Behavior: Behavior normal.    Assessment and Plan   1. Medicare annual wellness visit, subsequent  2. Hypothyroidism, unspecified type - TSH  3. Essential hypertension - CMP14+EGFR - Lipid panel - CBC  4. Carpal tunnel syndrome of left wrist  5. Need for vaccination - Flu Vaccine QUAD High Dose(Fluad)   Carpal tunnel- left; advising to use the wrist splint at night for next 1-2 wks to see if can improve.  If not improving to call or rto.  Pt stating not ready to see ortho yet for this.  htn- stable. Cont meds.  Hypothyroidism- stable. Cont meds.  HM- needing colonoscopy. Pt wanting to do the FOBT for colon screen.  Will give today to have pt return. Getting flu vaccine today. mammo up to date on 6/21- was normal.  F/u [address] prn.

## 2019-11-24 ENCOUNTER — Other Ambulatory Visit: Payer: Self-pay | Admitting: Family Medicine

## 2019-11-24 DIAGNOSIS — Z1211 Encounter for screening for malignant neoplasm of colon: Secondary | ICD-10-CM

## 2019-11-24 LAB — TSH: TSH: 2.19 u[IU]/mL (ref 0.450–4.500)

## 2019-11-24 LAB — CMP14+EGFR
ALT: 12 IU/L (ref 0–32)
AST: 17 IU/L (ref 0–40)
Albumin/Globulin Ratio: 1.6 (ref 1.2–2.2)
Albumin: 4.6 g/dL (ref 3.8–4.8)
Alkaline Phosphatase: 104 IU/L (ref 44–121)
BUN/Creatinine Ratio: 8 — ABNORMAL LOW (ref 12–28)
BUN: 8 mg/dL (ref 8–27)
Bilirubin Total: 0.4 mg/dL (ref 0.0–1.2)
CO2: 25 mmol/L (ref 20–29)
Calcium: 9.9 mg/dL (ref 8.7–10.3)
Chloride: 97 mmol/L (ref 96–106)
Creatinine, Ser: 0.97 mg/dL (ref 0.57–1.00)
GFR calc Af Amer: 70 mL/min/{1.73_m2} (ref >59)
GFR calc non Af Amer: 61 mL/min/{1.73_m2} (ref >59)
Globulin, Total: 2.9 g/dL (ref 1.5–4.5)
Glucose: 100 mg/dL — ABNORMAL HIGH (ref 65–99)
Potassium: 3.7 mmol/L (ref 3.5–5.2)
Sodium: 139 mmol/L (ref 134–144)
Total Protein: 7.5 g/dL (ref 6.0–8.5)

## 2019-11-24 LAB — LIPID PANEL
Chol/HDL Ratio: 4.1 ratio (ref 0.0–4.4)
Cholesterol, Total: 195 mg/dL (ref 100–199)
HDL: 48 mg/dL (ref >39)
LDL Chol Calc (NIH): 127 mg/dL — ABNORMAL HIGH (ref 0–99)
Triglycerides: 109 mg/dL (ref 0–149)
VLDL Cholesterol Cal: 20 mg/dL (ref 5–40)

## 2019-11-24 LAB — CBC
Hematocrit: 41.6 % (ref 34.0–46.6)
Hemoglobin: 13.8 g/dL (ref 11.1–15.9)
MCH: 27.8 pg (ref 26.6–33.0)
MCHC: 33.2 g/dL (ref 31.5–35.7)
MCV: 84 fL (ref 79–97)
Platelets: 300 10*3/uL (ref 150–450)
RBC: 4.96 x10E6/uL (ref 3.77–5.28)
RDW: 15.4 % (ref 11.7–15.4)
WBC: 7.1 10*3/uL (ref 3.4–10.8)

## 2019-11-24 LAB — IFOBT (OCCULT BLOOD): IFOBT: NEGATIVE

## 2020-02-03 ENCOUNTER — Ambulatory Visit: Payer: Medicare HMO | Attending: Internal Medicine

## 2020-02-03 DIAGNOSIS — Z23 Encounter for immunization: Secondary | ICD-10-CM

## 2020-02-03 NOTE — Progress Notes (Signed)
   Covid-19 Vaccination Clinic  Name:  Brittany Diaz    MRN: [id number] DOB: 06-27-1950  02/03/2020  Brittany Diaz was observed post Covid-19 immunization for 15 minutes without incident. She was provided with Vaccine Information Sheet and instruction to access the V-Safe system.   Brittany Diaz was instructed to call 911 with any severe reactions post vaccine: Marland Kitchen Difficulty breathing  . Swelling of face and throat  . A fast heartbeat  . A bad rash all over body  . Dizziness and weakness   Immunizations Administered    Name Date Dose VIS Date Route   Moderna Covid-19 Booster Vaccine 02/03/2020  1:17 PM 0.25 mL 12/01/2019 Intramuscular   Manufacturer: Moderna   Lot: [id number]   NDC: 00762-263-33

## 2020-02-09 ENCOUNTER — Other Ambulatory Visit: Payer: Self-pay | Admitting: Family Medicine

## 2020-04-13 ENCOUNTER — Other Ambulatory Visit: Payer: Self-pay | Admitting: Family Medicine

## 2020-05-23 ENCOUNTER — Ambulatory Visit (INDEPENDENT_AMBULATORY_CARE_PROVIDER_SITE_OTHER): Payer: Medicare HMO | Admitting: Family Medicine

## 2020-05-23 ENCOUNTER — Encounter: Payer: Self-pay | Admitting: Family Medicine

## 2020-05-23 ENCOUNTER — Other Ambulatory Visit: Payer: Self-pay

## 2020-05-23 VITALS — BP 136/84 | HR 66 | Temp 97.3°F | Ht 62.5 in | Wt 198.2 lb

## 2020-05-23 DIAGNOSIS — I10 Essential (primary) hypertension: Secondary | ICD-10-CM | POA: Diagnosis not present

## 2020-05-23 DIAGNOSIS — E039 Hypothyroidism, unspecified: Secondary | ICD-10-CM | POA: Diagnosis not present

## 2020-05-23 DIAGNOSIS — Z91018 Allergy to other foods: Secondary | ICD-10-CM

## 2020-05-23 MED ORDER — EPINEPHRINE 0.3 MG/0.3ML IJ SOAJ: 0.3000 mg | INTRAMUSCULAR | 1 refills | Status: AC | PRN

## 2020-05-23 NOTE — Progress Notes (Signed)
Patient ID: Brittany Diaz, female    DOB: Mar 26, 1950, 70 y.o.   MRN: [id number]   Chief Complaint  Patient presents with  . Hypertension  . Hypothyroidism   Subjective:    HPI Pt here for follow up on blood pressure and hypothyroid. Pt states she sometimes check blood pressure. Taking all meds as directed.   Pt states that she ate some trail mix with cranberries almonds and cashews and broke out into a rash/big knots all over; has resolved at this time.   Rash- Happened 1 wk ago.  Hand had hives and itching on arms and face. After nuts/cranberry. Took 2 benadryl.  No mouth or lip swelling.  No breathing difficulty.  In 1990's swelling from something, unknown.   Still eating cranberries daily and not issues.  Medical History Brittany Diaz has a past medical history of Hypertension and Thyroid disease.   Outpatient Encounter Medications as of 05/23/2020  Medication Sig  . albuterol (PROVENTIL HFA;VENTOLIN HFA) 108 (90 Base) MCG/ACT inhaler Inhale 2 puffs into the lungs every 4 (four) hours as needed.  . diclofenac Sodium (VOLTAREN) 1 % GEL Apply 2 g topically 4 (four) times daily as needed. For right hand pain prn.  Marland Kitchen EPINEPHrine 0.3 mg/0.3 mL IJ SOAJ injection Inject 0.3 mg into the muscle as needed for anaphylaxis.  Marland Kitchen levothyroxine (SYNTHROID) 75 MCG tablet TAKE 1 TABLET EVERY DAY  . triamterene-hydrochlorothiazide (MAXZIDE-25) 37.5-25 MG tablet TAKE 1 TABLET EVERY DAY   No facility-administered encounter medications on file as of 05/23/2020.     Review of Systems  Constitutional: Negative for chills and fever.  HENT: Negative for congestion, rhinorrhea and sore throat.   Respiratory: Negative for cough, shortness of breath and wheezing.   Cardiovascular: Negative for chest pain and leg swelling.  Gastrointestinal: Negative for abdominal pain, diarrhea, nausea and vomiting.  Genitourinary: Negative for dysuria and frequency.  Musculoskeletal: Negative for arthralgias  and back pain.  Skin: Positive for rash (hives/itching arms/face 1 wk ago).  Neurological: Negative for dizziness, weakness and headaches.     Vitals BP 136/84   Pulse 66   Temp (!) 97.3 F (36.3 C)   Ht 5' 2.5" (1.588 m)   Wt 198 lb 3.2 oz (89.9 kg)   SpO2 96%   BMI 35.67 kg/m   Objective:   Physical Exam Vitals and nursing note reviewed.  Constitutional:      Appearance: Normal appearance.  HENT:     Head: Normocephalic and atraumatic.     Nose: Nose normal.     Mouth/Throat:     Mouth: Mucous membranes are moist.     Pharynx: Oropharynx is clear.  Eyes:     Extraocular Movements: Extraocular movements intact.     Conjunctiva/sclera: Conjunctivae normal.     Pupils: Pupils are equal, round, and reactive to light.  Cardiovascular:     Rate and Rhythm: Normal rate and regular rhythm.     Pulses: Normal pulses.     Heart sounds: Normal heart sounds.  Pulmonary:     Effort: Pulmonary effort is normal.     Breath sounds: Normal breath sounds. No wheezing, rhonchi or rales.  Musculoskeletal:        General: Normal range of motion.     Right lower leg: No edema.     Left lower leg: No edema.  Skin:    General: Skin is warm and dry.     Findings: No lesion or rash.  Neurological:  General: No focal deficit present.     Mental Status: She is alert and oriented to person, place, and time.  Psychiatric:        Mood and Affect: Mood normal.        Behavior: Behavior normal.      Assessment and Plan   1. Hypothyroidism, unspecified type - TSH  2. Essential hypertension - Basic metabolic panel  3. 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  Dispense: 1 each; Refill: 1   HM- Pt wanted to do the hemoccult cards last visit, not wanting colonoscopy. Not having h/o colon cancer and not seeing bloody or black stools.  Hives-  resolved.  likley related to nuts. Pt advised if having another allergic reaction to take 2 benadryl and her inhaler if feeling sob or wheezing.  Then also gave epi pen if having lip swelling mouth swelling or breathing difficulty to give her self epi pen and go to ER.  Pt given referral to allergist for testing. Pt in agreement.  Hypothyroid- stable. Cont meds. Recheck tsh.  htn- stable. ordered bmp. Cont meds.  Return in about 6 months (around 11/22/2020) for f/u htn, thyroid.

## 2020-05-24 LAB — BASIC METABOLIC PANEL
BUN/Creatinine Ratio: 14 (ref 12–28)
BUN: 14 mg/dL (ref 8–27)
CO2: 23 mmol/L (ref 20–29)
Calcium: 9.8 mg/dL (ref 8.7–10.3)
Chloride: 99 mmol/L (ref 96–106)
Creatinine, Ser: 1 mg/dL (ref 0.57–1.00)
Glucose: 102 mg/dL — ABNORMAL HIGH (ref 65–99)
Potassium: 3.9 mmol/L (ref 3.5–5.2)
Sodium: 141 mmol/L (ref 134–144)
eGFR: 61 mL/min/{1.73_m2} (ref >59)

## 2020-05-24 LAB — TSH: TSH: 1.21 u[IU]/mL (ref 0.450–4.500)

## 2020-06-02 ENCOUNTER — Encounter: Payer: Self-pay | Admitting: Emergency Medicine

## 2020-06-02 ENCOUNTER — Ambulatory Visit
Admission: EM | Admit: 2020-06-02 | Discharge: 2020-06-02 | Disposition: A | Discharge status: Home / Self Care | Payer: Medicare HMO | Attending: Internal Medicine | Admitting: Internal Medicine

## 2020-06-02 DIAGNOSIS — L249 Irritant contact dermatitis, unspecified cause: Secondary | ICD-10-CM

## 2020-06-02 MED ORDER — TRIAMCINOLONE ACETONIDE 0.1 % EX CREA: 1.0000 "application " | TOPICAL_CREAM | Freq: Two times a day (BID) | CUTANEOUS | 0 refills | Status: DC

## 2020-06-02 NOTE — ED Provider Notes (Signed)
[facility]    CSN: [id number] Arrival date & time: 06/02/20  1643      History   Chief Complaint No chief complaint on file.   HPI Brittany Diaz is a 70 y.o. female presents today due to developing a rash on her neck that itches and burns x 2 weeks. Has been applying benadryl but is not helping. She  Denies wearing a neckless that it could have caused this. She states she was in the yard working in her flowers and she was wearing gloves and had a short sleeve T-shirt. She had a few bumps on her arms but has resolved. Does not have a rash anywhere else. Her granddaughter thought it could be from potatoe chips she ate.     Past Medical History:  Diagnosis Date  . Hypertension   . Thyroid disease     Patient Active Problem List   Diagnosis Date Noted  . 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 Osteopenia 05/26/2017  . Essential hypertension 04/01/2014  . Hypothyroidism 04/01/2014    Past Surgical History:  Procedure Laterality Date  . ABDOMINAL HYSTERECTOMY    . CESAREAN SECTION    . ELBOW SURGERY      OB History   No obstetric history on file.      Home Medications    Prior to Admission medications   Medication Sig Start Date End Date Taking? Authorizing Provider  albuterol (PROVENTIL HFA;VENTOLIN HFA) 108 (90 Base) MCG/ACT inhaler Inhale 2 puffs into the lungs every 4 (four) hours as needed. 04/30/17   Campbell Riches, NP  diclofenac Sodium (VOLTAREN) 1 % GEL Apply 2 g topically 4 (four) times daily as needed. For right hand pain prn. 08/23/19   Laroy Apple M, DO  EPINEPHrine 0.3 mg/0.3 mL IJ SOAJ injection Inject 0.3 mg into the muscle as needed for anaphylaxis. 05/23/20   Laroy Apple M, DO  levothyroxine (SYNTHROID) 75 MCG tablet TAKE 1 TABLET EVERY DAY 02/10/20   Laroy Apple M, DO  triamterene-hydrochlorothiazide (MAXZIDE-25) 37.5-25 MG tablet TAKE 1 TABLET EVERY DAY 04/14/20   Annalee Genta, DO    Family History Family  History  Problem Relation Age of Onset  . Thyroid disease Mother   . Hypertension Mother   . Hypertension Father   . Diabetes Father     Social History Social History   Tobacco Use  . Smoking status: Never Smoker  . Smokeless tobacco: Never Used  Substance Use Topics  . Alcohol use: No  . Drug use: No     Allergies   Patient has no known allergies.   Review of Systems Review of Systems  HENT: Negative for trouble swallowing.   Respiratory: Negative for choking, shortness of breath and wheezing.   Skin: Positive for rash.     Physical Exam Triage Vital Signs ED Triage Vitals  Enc Vitals Group     BP 06/02/20 1652 132/78     Pulse Rate 06/02/20 1652 80     Resp 06/02/20 1652 18     Temp 06/02/20 1652 98.3 F (36.8 C)     Temp Source 06/02/20 1652 Oral     SpO2 06/02/20 1652 94 %     Weight --      Height --      Head Circumference --      Peak Flow --      Pain Score 06/02/20 1653 2     Pain Loc --  Pain Edu? --      Excl. in GC? --    No data found.  Updated Vital Signs BP 132/78 (BP Location: Right Arm)   Pulse 80   Temp 98.3 F (36.8 C) (Oral)   Resp 18   SpO2 94%   Visual Acuity Right Eye Distance:   Left Eye Distance:   Bilateral Distance:    Right Eye Near:   Left Eye Near:    Bilateral Near:     Physical Exam Skin:    Findings: Rash present.     Comments: Hive like rash on her neck   Psychiatric:        Mood and Affect: Mood normal.        Behavior: Behavior normal.        Thought Content: Thought content normal.        Judgment: Judgment normal.      UC Treatments / Results  Labs (all labs ordered are listed, but only abnormal results are displayed) Labs Reviewed - No data to display  EKG   Radiology No results found.  Procedures Procedures (including critical care time)  Medications Ordered in UC Medications - No data to display  Initial Impression / Assessment and Plan / UC Course  I have reviewed the  triage vital signs and the nursing notes. Has local allergic reaction to unknown cause.  I placed her on Triamcinolone cream 0.1% as noted.  Final Clinical Impressions(s) / UC Diagnoses   Final diagnoses:  None   Discharge Instructions   None    ED Prescriptions    None     PDMP not reviewed this encounter.   Garey Ham, [place] 06/03/20 [phone]

## 2020-06-02 NOTE — ED Triage Notes (Signed)
Rash around neck area that itches and burns x 2 weeks.  Has been taking benadryl without relief

## 2020-06-12 ENCOUNTER — Encounter: Payer: Self-pay | Admitting: Family Medicine

## 2020-06-14 ENCOUNTER — Other Ambulatory Visit: Payer: Self-pay | Admitting: Family Medicine

## 2020-06-15 NOTE — Telephone Encounter (Signed)
No results found for: HGBA1C  Lab Results  Component Value Date   CREATININE 1.00 05/23/2020     Lab Results  Component Value Date   CHOL 195 11/23/2019   HDL 48 11/23/2019   LDLCALC 127 (H) 11/23/2019   TRIG 109 11/23/2019   CHOLHDL 4.1 11/23/2019     BP Readings from Last 3 Encounters:  06/02/20 132/78  05/23/20 136/84  11/23/19 126/86

## 2020-07-04 ENCOUNTER — Other Ambulatory Visit (HOSPITAL_COMMUNITY): Payer: Self-pay | Admitting: Family Medicine

## 2020-07-04 DIAGNOSIS — Z1231 Encounter for screening mammogram for malignant neoplasm of breast: Secondary | ICD-10-CM

## 2020-07-10 ENCOUNTER — Other Ambulatory Visit: Payer: Self-pay | Admitting: Family Medicine

## 2020-07-11 NOTE — Telephone Encounter (Signed)
Lab Results  Component Value Date   TSH 1.210 05/23/2020

## 2020-07-19 ENCOUNTER — Ambulatory Visit: Payer: Medicare HMO | Admitting: Allergy & Immunology

## 2020-08-11 ENCOUNTER — Ambulatory Visit (HOSPITAL_COMMUNITY): Payer: Medicare HMO

## 2020-08-25 ENCOUNTER — Ambulatory Visit: Payer: Medicare HMO | Admitting: Allergy & Immunology

## 2020-10-27 ENCOUNTER — Encounter: Payer: Self-pay | Admitting: Internal Medicine

## 2020-10-27 ENCOUNTER — Ambulatory Visit (INDEPENDENT_AMBULATORY_CARE_PROVIDER_SITE_OTHER): Payer: Medicare HMO | Admitting: Internal Medicine

## 2020-10-27 ENCOUNTER — Other Ambulatory Visit: Payer: Self-pay

## 2020-10-27 ENCOUNTER — Ambulatory Visit
Admission: RE | Admit: 2020-10-27 | Discharge: 2020-10-27 | Disposition: A | Discharge status: Home / Self Care | Payer: Medicare HMO | Source: Ambulatory Visit | Attending: Internal Medicine | Admitting: Internal Medicine

## 2020-10-27 VITALS — BP 138/88 | HR 69 | Temp 98.0°F | Resp 18 | Ht 64.0 in | Wt 199.0 lb

## 2020-10-27 DIAGNOSIS — E559 Vitamin D deficiency, unspecified: Secondary | ICD-10-CM

## 2020-10-27 DIAGNOSIS — I10 Essential (primary) hypertension: Secondary | ICD-10-CM

## 2020-10-27 DIAGNOSIS — Z1231 Encounter for screening mammogram for malignant neoplasm of breast: Secondary | ICD-10-CM

## 2020-10-27 DIAGNOSIS — Z0000 Encounter for general adult medical examination without abnormal findings: Secondary | ICD-10-CM

## 2020-10-27 DIAGNOSIS — M8580 Other specified disorders of bone density and structure, unspecified site: Secondary | ICD-10-CM | POA: Diagnosis not present

## 2020-10-27 DIAGNOSIS — Z1159 Encounter for screening for other viral diseases: Secondary | ICD-10-CM

## 2020-10-27 DIAGNOSIS — Z23 Encounter for immunization: Secondary | ICD-10-CM | POA: Diagnosis not present

## 2020-10-27 DIAGNOSIS — Z0001 Encounter for general adult medical examination with abnormal findings: Secondary | ICD-10-CM

## 2020-10-27 DIAGNOSIS — Z7689 Persons encountering health services in other specified circumstances: Secondary | ICD-10-CM | POA: Diagnosis not present

## 2020-10-27 DIAGNOSIS — Z91018 Allergy to other foods: Secondary | ICD-10-CM | POA: Diagnosis not present

## 2020-10-27 DIAGNOSIS — E039 Hypothyroidism, unspecified: Secondary | ICD-10-CM

## 2020-10-27 DIAGNOSIS — M159 Polyosteoarthritis, unspecified: Secondary | ICD-10-CM

## 2020-10-27 NOTE — Patient Instructions (Signed)
Please continue taking medications as prescribed.  Please continue to follow low salt diet and ambulate as tolerated.  Please get fasting blood tests done before the next visit. 

## 2020-10-27 NOTE — Assessment & Plan Note (Signed)
Uses Voltaren gel over knees and hands 

## 2020-10-27 NOTE — Assessment & Plan Note (Addendum)
BP Readings from Last 1 Encounters:  10/27/20 138/88   Well-controlled with Maxide Counseled for compliance with the medications Advised DASH diet and moderate exercise/walking as tolerated

## 2020-10-27 NOTE — Progress Notes (Signed)
New Patient Office Visit  Subjective:  Patient ID: Brittany Diaz, female    DOB: Dec 10, 1950  Age: 70 y.o. MRN: [id number]  CC:  Chief Complaint  Patient presents with   New Patient (Initial Visit)    New patient was seeing dr Brittany Diaz just establishing care     HPI Brittany Diaz is a 70 y.o. female with PMH of HTN, hypothyroidism, osteopenia and generalized OA who presents for establishing care.  She is a former patient of Dr. Lelon Diaz. Brittany Diaz.  HTN: Her blood pressure is well controlled with Maxide.  She denies any headache, dizziness, chest pain, dyspnea or palpitations.  Hypothyroidism: She has been taking levothyroxine regularly.  She reports intermittent fatigue, but denies any recent change in appetite or weight, tremors, vision problems, or recent head changes.  Her last DEXA scan showed osteopenia, but she is not on calcium and vitamin D supplements.  She has history of OA, for which she uses Voltaren gel.  She has had 3 doses of COVID-vaccine.  She received flu vaccine in the office today.  Past Medical History:  Diagnosis Date   Hypertension    Thyroid disease     Past Surgical History:  Procedure Laterality Date   ABDOMINAL HYSTERECTOMY     CESAREAN SECTION     ELBOW SURGERY      Family History  Problem Relation Age of Onset   Thyroid disease Mother    Hypertension Mother    Hypertension Father    Diabetes Father     Social History   Socioeconomic History   Marital status: Divorced    Spouse name: Not on file   Number of children: Not on file   Years of education: Not on file   Highest education level: Not on file  Occupational History   Not on file  Tobacco Use   Smoking status: Never   Smokeless tobacco: Never  Substance and Sexual Activity   Alcohol use: No   Drug use: No   Sexual activity: Never    Partners: Male    Birth control/protection: None  Other Topics Concern   Not on file  Social History Narrative   Not on file    Social Determinants of Health   Financial Resource Strain: Not on file  Food Insecurity: Not on file  Transportation Needs: Not on file  Physical Activity: Not on file  Stress: Not on file  Social Connections: Not on file  Intimate Partner Violence: Not on file    ROS Review of Systems  Constitutional:  Positive for fatigue. Negative for chills and fever.  HENT:  Negative for congestion, sinus pressure, sinus pain and sore throat.   Eyes:  Negative for pain and discharge.  Respiratory:  Negative for cough and shortness of breath.   Cardiovascular:  Negative for chest pain and palpitations.  Gastrointestinal:  Negative for abdominal pain, diarrhea, nausea and vomiting.  Endocrine: Negative for polydipsia and polyuria.  Genitourinary:  Negative for dysuria and hematuria.  Musculoskeletal:  Positive for arthralgias. Negative for neck pain and neck stiffness.  Skin:  Negative for rash.  Neurological:  Negative for dizziness and weakness.  Psychiatric/Behavioral:  Negative for agitation and behavioral problems.    Objective:   Today's Vitals: BP 138/88 (BP Location: Left Arm, Cuff Size: Normal)   Pulse 69   Temp 98 F (36.7 C) (Oral)   Resp 18   Ht 5\' 4"  (1.626 m)   Wt 199 lb 0.6 oz (90.3 kg)  SpO2 94%   BMI 34.17 kg/m   Physical Exam Vitals reviewed.  Constitutional:      General: She is not in acute distress.    Appearance: She is not diaphoretic.  HENT:     Head: Normocephalic and atraumatic.     Nose: Nose normal.     Mouth/Throat:     Mouth: Mucous membranes are moist.  Eyes:     General: No scleral icterus.    Extraocular Movements: Extraocular movements intact.  Cardiovascular:     Rate and Rhythm: Normal rate and regular rhythm.     Pulses: Normal pulses.     Heart sounds: Normal heart sounds. No murmur heard. Pulmonary:     Breath sounds: Normal breath sounds. No wheezing or rales.  Musculoskeletal:     Cervical back: Neck supple. No tenderness.      Right lower leg: No edema.     Left lower leg: No edema.  Skin:    General: Skin is warm.     Findings: No rash.  Neurological:     General: No focal deficit present.     Mental Status: She is alert and oriented to person, place, and time.  Psychiatric:        Mood and Affect: Mood normal.        Behavior: Behavior normal.    Assessment & Plan:   Problem List Items Addressed This Visit       Encounter to establish care - Primary   Care established History and medications reviewed with the patient        Cardiovascular and Mediastinum   Essential hypertension    BP Readings from Last 1 Encounters:  10/27/20 138/88  Well-controlled with Maxide Counseled for compliance with the medications Advised DASH diet and moderate exercise/walking as tolerated        Endocrine   Hypothyroidism    On levothyroxine 75 mcg daily Check TSH and free T4 before the next visit      Relevant Orders   TSH + free T4     Musculoskeletal and Integument   Osteopenia    Last DEXA scan reviewed from 2021 Advised to start taking calcium and vitamin D supplements.      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)    Keeps EpiPen Had trail mix this summer and diffuse rash after it, not allergy to peanuts            Other Visit Diagnoses        Need for immunization against influenza       Relevant Orders   Flu Vaccine QUAD High Dose(Fluad) (Completed)   Vitamin D deficiency       Relevant Orders   Vitamin D (25 hydroxy)   Need for hepatitis C screening test       Relevant Orders   Hepatitis C Antibody       Outpatient Encounter Medications as of 10/27/2020  Medication Sig   albuterol (PROVENTIL HFA;VENTOLIN HFA) 108 (90 Base) MCG/ACT inhaler Inhale 2 puffs into the lungs every 4 (four) hours as needed.   diclofenac Sodium (VOLTAREN) 1 % GEL Apply 2 g topically 4 (four) times daily as needed. For right  hand pain prn.   EPINEPHrine 0.3 mg/0.3 mL IJ SOAJ injection Inject 0.3 mg into the muscle as needed for anaphylaxis.   levothyroxine (SYNTHROID) 75 MCG  tablet TAKE 1 TABLET EVERY DAY   triamcinolone cream (KENALOG) 0.1 % Apply 1 application topically 2 (two) times daily. X 7-10 days   triamterene-hydrochlorothiazide (MAXZIDE-25) 37.5-25 MG tablet TAKE 1 TABLET EVERY DAY   No facility-administered encounter medications on file as of 10/27/2020.    Follow-up: Return in about 4 months (around 02/26/2021) for Annual physical.   Anabel Halon, MD

## 2020-10-27 NOTE — Assessment & Plan Note (Signed)
Keeps EpiPen Had trail mix this summer and diffuse rash after it, not allergy to peanuts

## 2020-10-27 NOTE — Assessment & Plan Note (Signed)
Last DEXA scan reviewed from 2021 Advised to start taking calcium and vitamin D supplements. 

## 2020-10-27 NOTE — Assessment & Plan Note (Signed)
Care established History and medications reviewed with the patient 

## 2020-10-27 NOTE — Assessment & Plan Note (Signed)
On levothyroxine 75 mcg daily Check TSH and free T4 before the next visit

## 2020-12-13 ENCOUNTER — Encounter (INDEPENDENT_AMBULATORY_CARE_PROVIDER_SITE_OTHER): Payer: Self-pay

## 2020-12-13 ENCOUNTER — Other Ambulatory Visit: Payer: Self-pay

## 2020-12-13 ENCOUNTER — Ambulatory Visit (INDEPENDENT_AMBULATORY_CARE_PROVIDER_SITE_OTHER): Payer: Medicare HMO

## 2020-12-13 DIAGNOSIS — Z0000 Encounter for general adult medical examination without abnormal findings: Secondary | ICD-10-CM | POA: Diagnosis not present

## 2020-12-13 NOTE — Progress Notes (Signed)
Subjective:   Brittany Diaz is a 70 y.o. female who presents for Medicare Annual (Subsequent) preventive examination.  I connected with  Brittany Diaz on 12/13/20 by a audio enabled telemedicine application and verified that I am speaking with the correct person using two identifiers.   I discussed the limitations, risks, security and privacy concerns of performing an evaluation and management service by telephone and the availability of in person appointments. I also discussed with the patient that there may be a patient responsible charge related to this service. The patient expressed understanding and verbally consented to this telephonic visit.   Review of Systems           Objective:    There were no vitals filed for this visit. There is no height or weight on file to calculate BMI.  No flowsheet data found.  Current Medications (verified) Outpatient Encounter Medications as of 12/13/2020  Medication Sig   albuterol (PROVENTIL HFA;VENTOLIN HFA) 108 (90 Base) MCG/ACT inhaler Inhale 2 puffs into the lungs every 4 (four) hours as needed.   diclofenac Sodium (VOLTAREN) 1 % GEL Apply 2 g topically 4 (four) times daily as needed. For right hand pain prn.   EPINEPHrine 0.3 mg/0.3 mL IJ SOAJ injection Inject 0.3 mg into the muscle as needed for anaphylaxis.   levothyroxine (SYNTHROID) 75 MCG tablet TAKE 1 TABLET EVERY DAY   triamcinolone cream (KENALOG) 0.1 % Apply 1 application topically 2 (two) times daily. X 7-10 days   triamterene-hydrochlorothiazide (MAXZIDE-25) 37.5-25 MG tablet TAKE 1 TABLET EVERY DAY   No facility-administered encounter medications on file as of 12/13/2020.    Allergies (verified) Patient has no known allergies.   History: Past Medical History:  Diagnosis Date   Hypertension    Thyroid disease    Past Surgical History:  Procedure Laterality Date   ABDOMINAL HYSTERECTOMY     CESAREAN SECTION     ELBOW SURGERY     Family History  Problem  Relation Age of Onset   Thyroid disease Mother    Hypertension Mother    Hypertension Father    Diabetes Father    Breast cancer Daughter    Social History   Socioeconomic History   Marital status: Divorced    Spouse name: Not on file   Number of children: Not on file   Years of education: Not on file   Highest education level: Not on file  Occupational History   Not on file  Tobacco Use   Smoking status: Never   Smokeless tobacco: Never  Substance and Sexual Activity   Alcohol use: No   Drug use: No   Sexual activity: Never    Partners: Male    Birth control/protection: None  Other Topics Concern   Not on file  Social History Narrative   Not on file   Social Determinants of Health   Financial Resource Strain: Not on file  Food Insecurity: Not on file  Transportation Needs: Not on file  Physical Activity: Not on file  Stress: Not on file  Social Connections: Not on file    Tobacco Counseling Counseling given: Not Answered   Clinical Intake:                 Diabetic?No         Activities of Daily Living In your present state of health, do you have any difficulty performing the following activities: 10/27/2020  Hearing? N  Vision? N  Difficulty concentrating or making decisions?  N  Walking or climbing stairs? N  Dressing or bathing? N  Doing errands, shopping? N  Some recent data might be hidden    Patient Care Team: Anabel Halon, MD as PCP - General (Internal Medicine)  Indicate any recent Medical Services you may have received from other than [facility] providers in the past year (date may be approximate).     Assessment:   This is a routine wellness examination for Brittany Diaz.  Hearing/Vision screen No results found.  Dietary issues and exercise activities discussed:     Goals Addressed   None   Depression Screen PHQ 2/9 Scores 10/27/2020 05/23/2020 11/23/2019 09/24/2018 04/30/2017 03/18/2017 03/05/2016  PHQ - 2 Score 0 0 0 0 0 0 0     Fall Risk Fall Risk  10/27/2020 05/23/2020 11/23/2019 08/23/2019 04/30/2017  Falls in the past year? 0 0 0 0 No  Number falls in past yr: 0 0 - 0 -  Injury with Fall? 0 0 - 0 -  Risk for fall due to : No Fall Risks No Fall Risks - - -  Follow up Falls evaluation completed Falls evaluation completed Falls evaluation completed Falls evaluation completed -    FALL RISK PREVENTION PERTAINING TO THE HOME:  Any stairs in or around the home? YES If so, are there any without handrails? Yes  Home free of loose throw rugs in walkways, pet beds, electrical cords, etc? Yes  Adequate lighting in your home to reduce risk of falls? Yes   ASSISTIVE DEVICES UTILIZED TO PREVENT FALLS:  Life alert? No  Use of a cane, walker or w/c? No  Grab bars in the bathroom? YES Shower chair or bench in shower? No  Elevated toilet seat or a handicapped toilet? No   TIMED UP AND GO:  Was the test performed? No .  Length of time to ambulate 10 feet: N/A sec.     Cognitive Function:        Immunizations Immunization History  Administered Date(s) Administered   Fluad Quad(high Dose 65+) 11/23/2019, 10/27/2020   Influenza,inj,Quad PF,6+ Mos 04/30/2017, 01/28/2018   Moderna SARS-COV2 Booster Vaccination 02/03/2020   Moderna Sars-Covid-2 Vaccination 04/22/2019, 05/25/2019   Pneumococcal Conjugate-13 09/24/2018   Pneumococcal Polysaccharide-23 04/30/2017    TDAP status: Due, Education has been provided regarding the importance of this vaccine. Advised may receive this vaccine at local pharmacy or Health Dept. Aware to provide a copy of the vaccination record if obtained from local pharmacy or Health Dept. Verbalized acceptance and understanding.  Flu Vaccine status: Up to date  Pneumococcal vaccine status: Up to date  Covid-19 vaccine status: Completed vaccines  Qualifies for Shingles Vaccine? Yes   Zostavax completed No   Shingrix Completed?: No.    Education has been provided regarding the  importance of this vaccine. Patient has been advised to call insurance company to determine out of pocket expense if they have not yet received this vaccine. Advised may also receive vaccine at local pharmacy or Health Dept. Verbalized acceptance and understanding.  Screening Tests Health Maintenance  Topic Date Due   Hepatitis C Screening  Never done   TETANUS/TDAP  Never done   Zoster Vaccines- Shingrix (1 of 2) Never done   COVID-19 Vaccine (3 - Moderna risk series) 03/02/2020   COLONOSCOPY (Pts 45-50yrs Insurance coverage will need to be confirmed)  05/23/2021 (Originally 05/03/2018)   MAMMOGRAM  10/28/2022   Pneumonia Vaccine 13+ Years old  Completed   INFLUENZA VACCINE  Completed   DEXA  SCAN  Completed   HPV VACCINES  Aged Out    Health Maintenance  Health Maintenance Due  Topic Date Due   Hepatitis C Screening  Never done   TETANUS/TDAP  Never done   Zoster Vaccines- Shingrix (1 of 2) Never done   COVID-19 Vaccine (3 - Moderna risk series) 03/02/2020    Colorectal cancer screening: Type of screening: Colonoscopy. Completed 05/02/2017. Repeat every 1 years  Mammogram status: Completed 10/27/2020. Repeat every year  Bone Density status: Completed 09/09/2019. Results reflect: Bone density results: OSTEOPENIA. Repeat every 5 years.  Lung Cancer Screening: (Low Dose CT Chest recommended if Age 36-80 years, 30 pack-year currently smoking OR have quit w/in 15years.) does not qualify.   Lung Cancer Screening Referral: no  Additional Screening:  Hepatitis C Screening: does qualify; Completed no  Vision Screening: Recommended annual ophthalmology exams for early detection of glaucoma and other disorders of the eye. Is the patient up to date with their annual eye exam?  No  Who is the provider or what is the name of the office in which the patient attends annual eye exams?  If pt is not established with a provider, would they like to be referred to a provider to establish care?  No .   Dental Screening: Recommended annual dental exams for proper oral hygiene  Community Resource Referral / Chronic Care Management: CRR required this visit?  No   CCM required this visit?  No      Plan:     I have personally reviewed and noted the following in the patient's chart:   Medical and social history Use of alcohol, tobacco or illicit drugs  Current medications and supplements including opioid prescriptions.  Functional ability and status Nutritional status Physical activity Advanced directives List of other physicians Hospitalizations, surgeries, and ER visits in previous 12 months Vitals Screenings to include cognitive, depression, and falls Referrals and appointments  In addition, I have reviewed and discussed with patient certain preventive protocols, quality metrics, and best practice recommendations. A written personalized care plan for preventive services as well as general preventive health recommendations were provided to patient.     Jasper Riling, CMA   12/13/2020   Nurse Notes: This is a tele health visit with the patient at home. Provider is in the office and is Rutwik Patel,MD.

## 2020-12-13 NOTE — Patient Instructions (Signed)
Brittany Diaz , Thank you for taking time to come for your Medicare Wellness Visit. I appreciate your ongoing commitment to your health goals. Please review the following plan we discussed and let me know if I can assist you in the future.   Screening recommendations/referrals: Colonoscopy: Not due Mammogram: complete Bone Density: complete Recommended yearly ophthalmology/optometry visit for glaucoma screening and checkup Recommended yearly dental visit for hygiene and checkup  Vaccinations: Influenza vaccine: complete Pneumococcal vaccine: complete Tdap vaccine: due now Shingles vaccine: due now     Advanced directives: Yes   Conditions/risks identified: Hypertension   Next appointment: 1 year    Preventive Care 63 Years and Older, Female Preventive care refers to lifestyle choices and visits with your health care provider that can promote health and wellness. What does preventive care include? A yearly physical exam. This is also called an annual well check. Dental exams once or twice a year. Routine eye exams. Ask your health care provider how often you should have your eyes checked. Personal lifestyle choices, including: Daily care of your teeth and gums. Regular physical activity. Eating a healthy diet. Avoiding tobacco and drug use. Limiting alcohol use. Practicing safe sex. Taking low-dose aspirin every day. Taking vitamin and mineral supplements as recommended by your health care provider. What happens during an annual well check? The services and screenings done by your health care provider during your annual well check will depend on your age, overall health, lifestyle risk factors, and family history of disease. Counseling  Your health care provider may ask you questions about your: Alcohol use. Tobacco use. Drug use. Emotional well-being. Home and relationship well-being. Sexual activity. Eating habits. History of falls. Memory and ability to understand  (cognition). Work and work Astronomer. Reproductive health. Screening  You may have the following tests or measurements: Height, weight, and BMI. Blood pressure. Lipid and cholesterol levels. These may be checked every 5 years, or more frequently if you are over 48 years old. Skin check. Lung cancer screening. You may have this screening every year starting at age 27 if you have a 30-pack-year history of smoking and currently smoke or have quit within the past 15 years. Fecal occult blood test (FOBT) of the stool. You may have this test every year starting at age 90. Flexible sigmoidoscopy or colonoscopy. You may have a sigmoidoscopy every 5 years or a colonoscopy every 10 years starting at age 58. Hepatitis C blood test. Hepatitis B blood test. Sexually transmitted disease (STD) testing. Diabetes screening. This is done by checking your blood sugar (glucose) after you have not eaten for a while (fasting). You may have this done every 1-3 years. Bone density scan. This is done to screen for osteoporosis. You may have this done starting at age 38. Mammogram. This may be done every 1-2 years. Talk to your health care provider about how often you should have regular mammograms. Talk with your health care provider about your test results, treatment options, and if necessary, the need for more tests. Vaccines  Your health care provider may recommend certain vaccines, such as: Influenza vaccine. This is recommended every year. Tetanus, diphtheria, and acellular pertussis (Tdap, Td) vaccine. You may need a Td booster every 10 years. Zoster vaccine. You may need this after age 38. Pneumococcal 13-valent conjugate (PCV13) vaccine. One dose is recommended after age 59. Pneumococcal polysaccharide (PPSV23) vaccine. One dose is recommended after age 31. Talk to your health care provider about which screenings and vaccines you need and how  often you need them. This information is not intended to  replace advice given to you by your health care provider. Make sure you discuss any questions you have with your health care provider. Document Released: 02/24/2015 Document Revised: 10/18/2015 Document Reviewed: 11/29/2014 Elsevier Interactive Patient Education  2017 [employer] Prevention in the Home Falls can cause injuries. They can happen to people of all ages. There are many things you can do to make your home safe and to help prevent falls. What can I do on the outside of my home? Regularly fix the edges of walkways and driveways and fix any cracks. Remove anything that might make you trip as you walk through a door, such as a raised step or threshold. Trim any bushes or trees on the path to your home. Use bright outdoor lighting. Clear any walking paths of anything that might make someone trip, such as rocks or tools. Regularly check to see if handrails are loose or broken. Make sure that both sides of any steps have handrails. Any raised decks and porches should have guardrails on the edges. Have any leaves, snow, or ice cleared regularly. Use sand or salt on walking paths during winter. Clean up any spills in your garage right away. This includes oil or grease spills. What can I do in the bathroom? Use night lights. Install grab bars by the toilet and in the tub and shower. Do not use towel bars as grab bars. Use non-skid mats or decals in the tub or shower. If you need to sit down in the shower, use a plastic, non-slip stool. Keep the floor dry. Clean up any water that spills on the floor as soon as it happens. Remove soap buildup in the tub or shower regularly. Attach bath mats securely with double-sided non-slip rug tape. Do not have throw rugs and other things on the floor that can make you trip. What can I do in the bedroom? Use night lights. Make sure that you have a light by your bed that is easy to reach. Do not use any sheets or blankets that are too big for  your bed. They should not hang down onto the floor. Have a firm chair that has side arms. You can use this for support while you get dressed. Do not have throw rugs and other things on the floor that can make you trip. What can I do in the kitchen? Clean up any spills right away. Avoid walking on wet floors. Keep items that you use a lot in easy-to-reach places. If you need to reach something above you, use a strong step stool that has a grab bar. Keep electrical cords out of the way. Do not use floor polish or wax that makes floors slippery. If you must use wax, use non-skid floor wax. Do not have throw rugs and other things on the floor that can make you trip. What can I do with my stairs? Do not leave any items on the stairs. Make sure that there are handrails on both sides of the stairs and use them. Fix handrails that are broken or loose. Make sure that handrails are as long as the stairways. Check any carpeting to make sure that it is firmly attached to the stairs. Fix any carpet that is loose or worn. Avoid having throw rugs at the top or bottom of the stairs. If you do have throw rugs, attach them to the floor with carpet tape. Make sure that you have a  light switch at the top of the stairs and the bottom of the stairs. If you do not have them, ask someone to add them for you. What else can I do to help prevent falls? Wear shoes that: Do not have high heels. Have rubber bottoms. Are comfortable and fit you well. Are closed at the toe. Do not wear sandals. If you use a stepladder: Make sure that it is fully opened. Do not climb a closed stepladder. Make sure that both sides of the stepladder are locked into place. Ask someone to hold it for you, if possible. Clearly mark and make sure that you can see: Any grab bars or handrails. First and last steps. Where the edge of each step is. Use tools that help you move around (mobility aids) if they are needed. These  include: Canes. Walkers. Scooters. Crutches. Turn on the lights when you go into a dark area. Replace any light bulbs as soon as they burn out. Set up your furniture so you have a clear path. Avoid moving your furniture around. If any of your floors are uneven, fix them. If there are any pets around you, be aware of where they are. Review your medicines with your doctor. Some medicines can make you feel dizzy. This can increase your chance of falling. Ask your doctor what other things that you can do to help prevent falls. This information is not intended to replace advice given to you by your health care provider. Make sure you discuss any questions you have with your health care provider. Document Released: 11/24/2008 Document Revised: 07/06/2015 Document Reviewed: 03/04/2014 Elsevier Interactive Patient Education  2017 [employer].

## 2021-02-15 ENCOUNTER — Encounter: Payer: Self-pay | Admitting: Nurse Practitioner

## 2021-02-15 ENCOUNTER — Other Ambulatory Visit: Payer: Self-pay

## 2021-02-15 ENCOUNTER — Encounter (INDEPENDENT_AMBULATORY_CARE_PROVIDER_SITE_OTHER): Payer: Self-pay

## 2021-02-15 ENCOUNTER — Ambulatory Visit (INDEPENDENT_AMBULATORY_CARE_PROVIDER_SITE_OTHER): Payer: Medicare HMO | Admitting: Nurse Practitioner

## 2021-02-15 DIAGNOSIS — U071 COVID-19: Secondary | ICD-10-CM

## 2021-02-15 MED ORDER — NIRMATRELVIR/RITONAVIR (PAXLOVID)TABLET: 3.0000 | ORAL_TABLET | Freq: Two times a day (BID) | ORAL | 0 refills | Status: DC

## 2021-02-15 MED ORDER — BENZONATATE 100 MG PO CAPS: 100.0000 mg | ORAL_CAPSULE | Freq: Two times a day (BID) | ORAL | 0 refills | Status: DC | PRN

## 2021-02-15 NOTE — Assessment & Plan Note (Signed)
Covid 19 infection. Paxlovid BID for 5 days Tessalon 100mg  BID prn cough Tylenol as nneded for fever Stay on isolation for 5 days.  Drink plenty of fluid to stay hydrated .

## 2021-02-15 NOTE — Progress Notes (Signed)
Virtual Visit via Telephone Note  I connected with Brittany Diaz on 02/15/21 at 1140 am by telephone and verified that I am speaking with the correct person using two identifiers. Is spent 8 minutes talking to pt and reviewing her chart.  Location: Patient: home Provider: work   I discussed the limitations, risks, security and privacy concerns of performing an evaluation and management service by telephone and the availability of in person appointments. I also discussed with the patient that there may be a patient responsible charge related to this service. The patient expressed understanding and agreed to proceed.   History of Present Illness: Pt c/o Productive cough for 3 days , sore throat trouble breathing, wheezing, does not  know color of sputum, denied CP, bloody sputum, fever, chills,. Has had  covid booster, flu vaccine. Has not been around anyone sick. Tested positive for COVID at home on 02/14/2021. Symptoms are getting better.    Observations/Objective:   Assessment and Plan: Covid 19 infection. Paxlovid BID for 5 days Tessalon 100mg  BID prn cough Tylenol as nneded for fever Albuterol inhaler as needed for wheezing Stay on isolation for 5 days.  Drink plenty of fluid to stay hydrated .   Follow Up Instructions:    I discussed the assessment and treatment plan with the patient. The patient was provided an opportunity to ask questions and all were answered. The patient agreed with the plan and demonstrated an understanding of the instructions.   The patient was advised to call back or seek an in-person evaluation if the symptoms worsen or if the condition fails to improve as anticipated.

## 2021-02-16 ENCOUNTER — Other Ambulatory Visit: Payer: Self-pay | Admitting: *Deleted

## 2021-02-16 ENCOUNTER — Telehealth: Payer: Self-pay | Admitting: Internal Medicine

## 2021-02-16 DIAGNOSIS — U071 COVID-19: Secondary | ICD-10-CM

## 2021-02-16 MED ORDER — NIRMATRELVIR/RITONAVIR (PAXLOVID)TABLET: 3.0000 | ORAL_TABLET | Freq: Two times a day (BID) | ORAL | 0 refills | Status: AC

## 2021-02-16 NOTE — Telephone Encounter (Signed)
Paxlovid was sent yesterday by [facility] this is not something that be sent to mail order pharmacy please ask pt if [facility] is out of medication if so she can have this prescription sent to another local pharmacy and they will transfer for her

## 2021-02-16 NOTE — Telephone Encounter (Signed)
Pt called in regard to meds sent in to pharm yesterday   Pt states that [facility] will not fill the meds ,   And pt suggest them be sent to [facility]

## 2021-02-16 NOTE — Telephone Encounter (Signed)
Pt spoke with Pharm and pharm will express med to pt

## 2021-02-16 NOTE — Telephone Encounter (Signed)
Sent to [facility] 

## 2021-03-01 DIAGNOSIS — Z0000 Encounter for general adult medical examination without abnormal findings: Secondary | ICD-10-CM | POA: Diagnosis not present

## 2021-03-01 DIAGNOSIS — E039 Hypothyroidism, unspecified: Secondary | ICD-10-CM | POA: Diagnosis not present

## 2021-03-01 DIAGNOSIS — Z1159 Encounter for screening for other viral diseases: Secondary | ICD-10-CM | POA: Diagnosis not present

## 2021-03-01 DIAGNOSIS — E559 Vitamin D deficiency, unspecified: Secondary | ICD-10-CM | POA: Diagnosis not present

## 2021-03-02 ENCOUNTER — Ambulatory Visit (INDEPENDENT_AMBULATORY_CARE_PROVIDER_SITE_OTHER): Payer: Medicare HMO | Admitting: Internal Medicine

## 2021-03-02 ENCOUNTER — Encounter: Payer: Self-pay | Admitting: Internal Medicine

## 2021-03-02 ENCOUNTER — Encounter: Payer: Medicare HMO | Admitting: Internal Medicine

## 2021-03-02 ENCOUNTER — Other Ambulatory Visit: Payer: Self-pay

## 2021-03-02 VITALS — BP 132/84 | HR 71 | Ht 64.0 in | Wt 202.1 lb

## 2021-03-02 DIAGNOSIS — E039 Hypothyroidism, unspecified: Secondary | ICD-10-CM

## 2021-03-02 DIAGNOSIS — Z0001 Encounter for general adult medical examination with abnormal findings: Secondary | ICD-10-CM | POA: Diagnosis not present

## 2021-03-02 DIAGNOSIS — I10 Essential (primary) hypertension: Secondary | ICD-10-CM | POA: Diagnosis not present

## 2021-03-02 DIAGNOSIS — E559 Vitamin D deficiency, unspecified: Secondary | ICD-10-CM | POA: Diagnosis not present

## 2021-03-02 DIAGNOSIS — Z1211 Encounter for screening for malignant neoplasm of colon: Secondary | ICD-10-CM

## 2021-03-02 LAB — CMP14+EGFR
ALT: 18 IU/L (ref 0–32)
AST: 20 IU/L (ref 0–40)
Albumin/Globulin Ratio: 1.4 (ref 1.2–2.2)
Albumin: 4.3 g/dL (ref 3.8–4.8)
Alkaline Phosphatase: 97 IU/L (ref 44–121)
BUN/Creatinine Ratio: 12 (ref 12–28)
BUN: 12 mg/dL (ref 8–27)
Bilirubin Total: 0.3 mg/dL (ref 0.0–1.2)
CO2: 26 mmol/L (ref 20–29)
Calcium: 9.9 mg/dL (ref 8.7–10.3)
Chloride: 100 mmol/L (ref 96–106)
Creatinine, Ser: 1.03 mg/dL — ABNORMAL HIGH (ref 0.57–1.00)
Globulin, Total: 3 g/dL (ref 1.5–4.5)
Glucose: 111 mg/dL — ABNORMAL HIGH (ref 70–99)
Potassium: 4.5 mmol/L (ref 3.5–5.2)
Sodium: 141 mmol/L (ref 134–144)
Total Protein: 7.3 g/dL (ref 6.0–8.5)
eGFR: 59 mL/min/{1.73_m2} — ABNORMAL LOW (ref >59)

## 2021-03-02 LAB — CBC WITH DIFFERENTIAL/PLATELET
Basophils Absolute: 0.1 10*3/uL (ref 0.0–0.2)
Basos: 1 %
EOS (ABSOLUTE): 0.2 10*3/uL (ref 0.0–0.4)
Eos: 3 %
Hematocrit: 40.1 % (ref 34.0–46.6)
Hemoglobin: 13.3 g/dL (ref 11.1–15.9)
Immature Grans (Abs): 0 10*3/uL (ref 0.0–0.1)
Immature Granulocytes: 0 %
Lymphocytes Absolute: 1.7 10*3/uL (ref 0.7–3.1)
Lymphs: 25 %
MCH: 27.8 pg (ref 26.6–33.0)
MCHC: 33.2 g/dL (ref 31.5–35.7)
MCV: 84 fL (ref 79–97)
Monocytes Absolute: 0.5 10*3/uL (ref 0.1–0.9)
Monocytes: 7 %
Neutrophils Absolute: 4.5 10*3/uL (ref 1.4–7.0)
Neutrophils: 64 %
Platelets: 345 10*3/uL (ref 150–450)
RBC: 4.79 x10E6/uL (ref 3.77–5.28)
RDW: 14.7 % (ref 11.7–15.4)
WBC: 7 10*3/uL (ref 3.4–10.8)

## 2021-03-02 LAB — LIPID PANEL
Chol/HDL Ratio: 4.5 ratio — ABNORMAL HIGH (ref 0.0–4.4)
Cholesterol, Total: 203 mg/dL — ABNORMAL HIGH (ref 100–199)
HDL: 45 mg/dL (ref >39)
LDL Chol Calc (NIH): 138 mg/dL — ABNORMAL HIGH (ref 0–99)
Triglycerides: 108 mg/dL (ref 0–149)
VLDL Cholesterol Cal: 20 mg/dL (ref 5–40)

## 2021-03-02 LAB — HEPATITIS C ANTIBODY: Hep C Virus Ab: <0.1 s/co ratio (ref 0.0–0.9)

## 2021-03-02 LAB — TSH+FREE T4
Free T4: 1.52 ng/dL (ref 0.82–1.77)
TSH: 3.13 u[IU]/mL (ref 0.450–4.500)

## 2021-03-02 LAB — VITAMIN D 25 HYDROXY (VIT D DEFICIENCY, FRACTURES): Vit D, 25-Hydroxy: 15.3 ng/mL — ABNORMAL LOW (ref 30.0–100.0)

## 2021-03-02 MED ORDER — VITAMIN D (ERGOCALCIFEROL) 1.25 MG (50000 UNIT) PO CAPS: 50000.0000 [IU] | ORAL_CAPSULE | ORAL | 1 refills | Status: DC

## 2021-03-02 NOTE — Assessment & Plan Note (Signed)
BP Readings from Last 1 Encounters:  03/02/21 132/84   Well-controlled with Maxzide Counseled for compliance with the medications Advised DASH diet and moderate exercise/walking as tolerated

## 2021-03-02 NOTE — Assessment & Plan Note (Signed)
Lab Results  Component Value Date   TSH 3.130 03/01/2021   On levothyroxine 75 mcg daily 

## 2021-03-02 NOTE — Progress Notes (Addendum)
Established Patient Office Visit  Subjective:  Patient ID: Brittany Diaz, female    DOB: October 02, 1950  Age: 71 y.o. MRN: [id number]  CC:  Chief Complaint  Patient presents with   Annual Exam    CPE     HPI [name] is a 71 y.o. female with past medical history of HTN, hypothyroidism, osteopenia and generalized OA who presents for annual physical.  HTN: BP is well-controlled. Takes medications regularly. Patient denies headache, dizziness, chest pain, dyspnea or palpitations.  She recently had COVID about 2 weeks ago and has completed Paxlovid.  She feels better now.  Denies any cough or dyspnea currently.  Hypothyroidism: She has been taking levothyroxine regularly.  She reports intermittent fatigue, but denies any recent change in appetite or weight, tremors, vision problems, or recent hair changes.  Blood tests were reviewed and discussed with the patient in detail.  Past Medical History:  Diagnosis Date   Hypertension    Thyroid disease     Past Surgical History:  Procedure Laterality Date   ABDOMINAL HYSTERECTOMY     CESAREAN SECTION     ELBOW SURGERY      Family History  Problem Relation Age of Onset   Thyroid disease Mother    Hypertension Mother    Hypertension Father    Diabetes Father    Breast cancer Daughter     Social History   Socioeconomic History   Marital status: Divorced    Spouse name: Not on file   Number of children: Not on file   Years of education: Not on file   Highest education level: Not on file  Occupational History   Not on file  Tobacco Use   Smoking status: Never   Smokeless tobacco: Never  Substance and Sexual Activity   Alcohol use: No   Drug use: No   Sexual activity: Never    Partners: Male    Birth control/protection: None  Other Topics Concern   Not on file  Social History Narrative   Not on file   Social Determinants of Health   Financial Resource Strain: Low Risk    Difficulty of Paying Living  Expenses: Not hard at all  Food Insecurity: No Food Insecurity   Worried About Charity fundraiser in the Last Year: Never true   [place] in the Last Year: Never true  Transportation Needs: No Transportation Needs   Lack of Transportation (Medical): No   Lack of Transportation (Non-Medical): No  Physical Activity: Insufficiently Active   Days of Exercise per Week: 3 days   Minutes of Exercise per Session: 30 min  Stress: No Stress Concern Present   Feeling of Stress : Not at all  Social Connections: Moderately Integrated   Frequency of Communication with Friends and Family: More than three times a week   Frequency of Social Gatherings with Friends and Family: More than three times a week   Attends Religious Services: More than 4 times per year   Active Member of [employer] or Organizations: Yes   Attends Archivist Meetings: 1 to 4 times per year   Marital Status: Divorced  Human resources officer Violence: Not At Risk   Fear of Current or Ex-Partner: No   Emotionally Abused: No   Physically Abused: No   Sexually Abused: No    Outpatient Medications Prior to Visit  Medication Sig Dispense Refill   albuterol (PROVENTIL HFA;VENTOLIN HFA) 108 (90 Base) MCG/ACT inhaler Inhale 2 puffs into  the lungs every 4 (four) hours as needed. 1 Inhaler 0   benzonatate (TESSALON) 100 MG capsule Take 1 capsule (100 mg total) by mouth 2 (two) times daily as needed for cough. 20 capsule 0   diclofenac Sodium (VOLTAREN) 1 % GEL Apply 2 g topically 4 (four) times daily as needed. For right hand pain prn. 50 g 1   EPINEPHrine 0.3 mg/0.3 mL IJ SOAJ injection Inject 0.3 mg into the muscle as needed for anaphylaxis. 1 each 1   levothyroxine (SYNTHROID) 75 MCG tablet TAKE 1 TABLET EVERY DAY 90 tablet 2   triamcinolone cream (KENALOG) 0.1 % Apply 1 application topically 2 (two) times daily. X 7-10 days 135 each 0   triamterene-hydrochlorothiazide (MAXZIDE-25) 37.5-25 MG tablet TAKE 1 TABLET EVERY DAY 90  tablet 1   No facility-administered medications prior to visit.    No Known Allergies  ROS Review of Systems  Constitutional:  Negative for chills and fever.  HENT:  Negative for congestion, sinus pressure, sinus pain and sore throat.   Eyes:  Negative for pain and discharge.  Respiratory:  Negative for cough and shortness of breath.   Cardiovascular:  Negative for chest pain and palpitations.  Gastrointestinal:  Negative for abdominal pain, diarrhea, nausea and vomiting.  Endocrine: Negative for polydipsia and polyuria.  Genitourinary:  Negative for dysuria and hematuria.  Musculoskeletal:  Positive for arthralgias. Negative for neck pain and neck stiffness.  Skin:  Negative for rash.  Neurological:  Negative for dizziness and weakness.  Psychiatric/Behavioral:  Negative for agitation and behavioral problems.      Objective:    Physical Exam Vitals reviewed.  Constitutional:      General: She is not in acute distress.    Appearance: She is not diaphoretic.  HENT:     Head: Normocephalic and atraumatic.     Nose: Nose normal.     Mouth/Throat:     Mouth: Mucous membranes are moist.  Eyes:     General: No scleral icterus.    Extraocular Movements: Extraocular movements intact.  Cardiovascular:     Rate and Rhythm: Normal rate and regular rhythm.     Pulses: Normal pulses.     Heart sounds: Normal heart sounds. No murmur heard. Pulmonary:     Breath sounds: Normal breath sounds. No wheezing or rales.  Abdominal:     Palpations: Abdomen is soft.     Tenderness: There is no abdominal tenderness.  Musculoskeletal:     Cervical back: Neck supple. No tenderness.     Right lower leg: No edema.     Left lower leg: No edema.  Skin:    General: Skin is warm.     Findings: No rash.  Neurological:     General: No focal deficit present.     Mental Status: She is alert and oriented to person, place, and time.     Cranial Nerves: No cranial nerve deficit.     Sensory: No  sensory deficit.     Motor: No weakness.  Psychiatric:        Mood and Affect: Mood normal.        Behavior: Behavior normal.    BP 132/84   Pulse 71   Ht _0  (1.626 m)   Wt 202 lb 1.9 oz (91.7 kg)   SpO2 92%   BMI 34.69 kg/m  Wt Readings from Last 3 Encounters:  03/02/21 202 lb 1.9 oz (91.7 kg)  10/27/20 199 lb 0.6 oz (90.3 kg)  05/23/20 198 lb 3.2 oz (89.9 kg)    Lab Results  Component Value Date   TSH 3.130 03/01/2021   Lab Results  Component Value Date   WBC 7.0 03/01/2021   HGB 13.3 03/01/2021   HCT 40.1 03/01/2021   MCV 84 03/01/2021   PLT 345 03/01/2021   Lab Results  Component Value Date   NA 141 03/01/2021   K 4.5 03/01/2021   CO2 26 03/01/2021   GLUCOSE 111 (H) 03/01/2021   BUN 12 03/01/2021   CREATININE 1.03 (H) 03/01/2021   BILITOT 0.3 03/01/2021   ALKPHOS 97 03/01/2021   AST 20 03/01/2021   ALT 18 03/01/2021   PROT 7.3 03/01/2021   ALBUMIN 4.3 03/01/2021   CALCIUM 9.9 03/01/2021   EGFR 59 (L) 03/01/2021   Lab Results  Component Value Date   CHOL 203 (H) 03/01/2021   Lab Results  Component Value Date   HDL 45 03/01/2021   Lab Results  Component Value Date   LDLCALC 138 (H) 03/01/2021   Lab Results  Component Value Date   TRIG 108 03/01/2021   Lab Results  Component Value Date   CHOLHDL 4.5 (H) 03/01/2021   No results found for: HGBA1C    Assessment & Plan:   Problem List Items Addressed This Visit       Cardiovascular and Mediastinum   Essential hypertension    BP Readings from Last 1 Encounters:  03/02/21 132/84  Well-controlled with Maxzide Counseled for compliance with the medications Advised DASH diet and moderate exercise/walking as tolerated        Endocrine   Hypothyroidism    Lab Results  Component Value Date   TSH 3.130 03/01/2021  On levothyroxine 75 mcg daily        Other   Vitamin D deficiency    Last vitamin D Lab Results  Component Value Date   VD25OH 15.3 (L) 03/01/2021  Started vitamin  D 50,000 IU qw      Relevant Medications   Vitamin D, Ergocalciferol, (DRISDOL) 1.25 MG (50000 UNIT) CAPS capsule   Encounter for general adult medical examination with abnormal findings - Primary    Physical exam as documented. Counseling done  re healthy lifestyle involving commitment to 150 minutes exercise per week, heart healthy diet, and attaining healthy weight.The importance of adequate sleep also discussed. Changes in health habits are decided on by the patient with goals and time frames  set for achieving them. Immunization and cancer screening needs are specifically addressed at this visit.      Other Visit Diagnoses     Colon cancer screening       Relevant Orders   Cologuard       Meds ordered this encounter  Medications   Vitamin D, Ergocalciferol, (DRISDOL) 1.25 MG (50000 UNIT) CAPS capsule    Sig: Take 1 capsule (50,000 Units total) by mouth every 7 (seven) days.    Dispense:  12 capsule    Refill:  1    Follow-up: Return in about 6 months (around 08/30/2021) for Hypothyroidism.    Lindell Spar, MD

## 2021-03-02 NOTE — Patient Instructions (Addendum)
Please start taking Vitamin D as prescribed.  Please continue taking other medications as prescribed.  Please avoid oily and fried food and focus on green vegetables and fruits.  Please consider getting Shingrix vaccine at your local pharmacy.

## 2021-03-02 NOTE — Assessment & Plan Note (Signed)
Last vitamin D Lab Results  Component Value Date   VD25OH 15.3 (L) 03/01/2021   Started vitamin D 50,000 IU qw

## 2021-03-02 NOTE — Assessment & Plan Note (Signed)

## 2021-03-08 DIAGNOSIS — Z1211 Encounter for screening for malignant neoplasm of colon: Secondary | ICD-10-CM | POA: Diagnosis not present

## 2021-03-15 LAB — COLOGUARD: COLOGUARD: NEGATIVE

## 2021-06-04 ENCOUNTER — Other Ambulatory Visit: Payer: Self-pay | Admitting: *Deleted

## 2021-06-04 ENCOUNTER — Telehealth: Payer: Self-pay

## 2021-06-04 DIAGNOSIS — E559 Vitamin D deficiency, unspecified: Secondary | ICD-10-CM

## 2021-06-04 MED ORDER — VITAMIN D (ERGOCALCIFEROL) 1.25 MG (50000 UNIT) PO CAPS: 50000.0000 [IU] | ORAL_CAPSULE | ORAL | 1 refills | Status: DC

## 2021-06-04 NOTE — Telephone Encounter (Signed)
Patient called said does Dr Allena Katz want her to continue Vitamin D2? She uses [facility] in [place].  Please contact patient back at [phone].  ?

## 2021-06-04 NOTE — Telephone Encounter (Signed)
Pt medication sent to [facility] pharmacy  

## 2021-06-07 ENCOUNTER — Other Ambulatory Visit: Payer: Self-pay | Admitting: *Deleted

## 2021-06-07 ENCOUNTER — Telehealth: Payer: Self-pay | Admitting: Internal Medicine

## 2021-06-07 DIAGNOSIS — E559 Vitamin D deficiency, unspecified: Secondary | ICD-10-CM

## 2021-06-07 MED ORDER — VITAMIN D (ERGOCALCIFEROL) 1.25 MG (50000 UNIT) PO CAPS: 50000.0000 [IU] | ORAL_CAPSULE | ORAL | 1 refills | Status: DC

## 2021-06-07 NOTE — Telephone Encounter (Signed)
Pt called stating that the VITAMIN D at [facility] is too expensive. She is wanting to know if the medication can please be sent back to [facility] Mail order? ?  ?  ?[facility]  ?

## 2021-06-07 NOTE — Telephone Encounter (Signed)
Medication sent to [facility]  °

## 2021-06-07 NOTE — Telephone Encounter (Deleted)
Pt called stating that the VITAMIN D at [facility] is too expensive. She is wanting to know if the medication can please be sent back to [facility] Mail order? ?  ?  ?[facility] order  ?

## 2021-06-28 ENCOUNTER — Ambulatory Visit: Payer: Medicare HMO | Admitting: Nurse Practitioner

## 2021-07-02 ENCOUNTER — Telehealth: Payer: Self-pay | Admitting: Internal Medicine
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
## 2021-07-02 NOTE — Telephone Encounter (Signed)
Pt needs an appointment

## 2021-07-03 ENCOUNTER — Ambulatory Visit (INDEPENDENT_AMBULATORY_CARE_PROVIDER_SITE_OTHER): Payer: Medicare HMO | Admitting: Internal Medicine

## 2021-07-03 ENCOUNTER — Encounter: Payer: Self-pay | Admitting: Internal Medicine

## 2021-07-03 VITALS — BP 140/88 | HR 73 | Resp 18 | Ht 62.0 in | Wt 203.0 lb

## 2021-07-03 DIAGNOSIS — M545 Low back pain, unspecified: Secondary | ICD-10-CM

## 2021-07-03 DIAGNOSIS — M5441 Lumbago with sciatica, right side: Secondary | ICD-10-CM | POA: Diagnosis not present

## 2021-07-03 MED ORDER — CYCLOBENZAPRINE HCL 5 MG PO TABS: 5.0000 mg | ORAL_TABLET | Freq: Two times a day (BID) | ORAL | 0 refills | Status: DC | PRN

## 2021-07-03 MED ORDER — KETOROLAC TROMETHAMINE 60 MG/2ML IM SOLN
60.0000 mg | Freq: Once | INTRAMUSCULAR | Status: AC
  Administered 2021-07-03: 60 mg via INTRAMUSCULAR

## 2021-07-03 MED ORDER — METHYLPREDNISOLONE ACETATE 80 MG/ML IJ SUSP
80.0000 mg | Freq: Once | INTRAMUSCULAR | Status: AC
  Administered 2021-07-03: 80 mg via INTRAMUSCULAR

## 2021-07-03 MED ORDER — PREDNISONE 10 MG (21) PO TBPK: ORAL_TABLET | ORAL | 0 refills | Status: DC
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
## 2021-07-03 NOTE — Patient Instructions (Signed)
Please start taking Prednisone as prescribed. Do not take Ibuprofen or Aleve while taking Prednisone. Okay to take Tylenol as needed for back pain.  Please take Flexeril as needed for back muscle spasm/stiffness. Do not drive or operate heavy machinery after taking Flexeril.

## 2021-07-05 ENCOUNTER — Other Ambulatory Visit: Payer: Self-pay | Admitting: Family Medicine

## 2021-07-20 ENCOUNTER — Ambulatory Visit (HOSPITAL_COMMUNITY)
Admission: RE | Admit: 2021-07-20 | Discharge: 2021-07-20 | Disposition: A | Discharge status: Home / Self Care | Payer: Medicare HMO | Source: Ambulatory Visit | Attending: Internal Medicine | Admitting: Internal Medicine

## 2021-07-20 ENCOUNTER — Encounter: Payer: Self-pay | Admitting: Internal Medicine

## 2021-07-20 ENCOUNTER — Ambulatory Visit (INDEPENDENT_AMBULATORY_CARE_PROVIDER_SITE_OTHER): Payer: Medicare HMO | Admitting: Internal Medicine

## 2021-07-20 VITALS — BP 124/86 | HR 89 | Resp 18 | Ht 62.0 in | Wt 203.4 lb

## 2021-07-20 DIAGNOSIS — M545 Low back pain, unspecified: Secondary | ICD-10-CM | POA: Insufficient documentation

## 2021-07-20 DIAGNOSIS — M25511 Pain in right shoulder: Secondary | ICD-10-CM

## 2021-07-20 DIAGNOSIS — G8929 Other chronic pain: Secondary | ICD-10-CM | POA: Diagnosis not present

## 2021-07-20 MED ORDER — CELECOXIB 100 MG PO CAPS: 100.0000 mg | ORAL_CAPSULE | Freq: Every day | ORAL | 0 refills | Status: DC

## 2021-07-20 NOTE — Assessment & Plan Note (Signed)
Worse with movement Could be muscle strain in addition to underlying OA Celebrex as needed for pain Flexeril as needed for muscle spasms Referred to orthopedic surgeon

## 2021-07-20 NOTE — Patient Instructions (Signed)
Please take Celebrex for shoulder and back pain.  Please get X-rays of right shoulder and lumbar spine at [facility].  You are being referred to Orthopedic surgeon.

## 2021-07-20 NOTE — Assessment & Plan Note (Signed)
Likely due to recent injury/muscle stiffness Celebrex PRN for pain Flexeril as needed for muscle spasm/stiffness Tylenol as needed for back pain Advised to avoid heavy lifting and frequent bending Heating pad and/or back brace will get x-ray of lumbar spine Referred to Orthopedic surgeon
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
## 2021-08-21 ENCOUNTER — Ambulatory Visit (INDEPENDENT_AMBULATORY_CARE_PROVIDER_SITE_OTHER): Payer: Medicare HMO | Admitting: Orthopedic Surgery

## 2021-08-21 ENCOUNTER — Encounter: Payer: Self-pay | Admitting: Orthopedic Surgery

## 2021-08-21 VITALS — BP 141/75 | HR 74 | Ht 62.0 in | Wt 204.0 lb

## 2021-08-21 DIAGNOSIS — M7581 Other shoulder lesions, right shoulder: Secondary | ICD-10-CM

## 2021-08-21 MED ORDER — CYCLOBENZAPRINE HCL 10 MG PO TABS: 10.0000 mg | ORAL_TABLET | Freq: Two times a day (BID) | ORAL | 0 refills | Status: AC | PRN

## 2021-08-21 NOTE — Patient Instructions (Signed)
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
## 2021-08-30 ENCOUNTER — Ambulatory Visit (INDEPENDENT_AMBULATORY_CARE_PROVIDER_SITE_OTHER): Payer: Medicare HMO | Admitting: Internal Medicine

## 2021-08-30 ENCOUNTER — Encounter: Payer: Self-pay | Admitting: Internal Medicine

## 2021-08-30 VITALS — BP 158/90 | HR 67 | Ht 62.0 in | Wt 202.0 lb

## 2021-08-30 DIAGNOSIS — E039 Hypothyroidism, unspecified: Secondary | ICD-10-CM | POA: Diagnosis not present

## 2021-08-30 DIAGNOSIS — G8929 Other chronic pain: Secondary | ICD-10-CM

## 2021-08-30 DIAGNOSIS — M25511 Pain in right shoulder: Secondary | ICD-10-CM | POA: Diagnosis not present

## 2021-08-30 DIAGNOSIS — I10 Essential (primary) hypertension: Secondary | ICD-10-CM | POA: Diagnosis not present

## 2021-08-30 MED ORDER — AMLODIPINE BESYLATE 5 MG PO TABS: 5.0000 mg | ORAL_TABLET | Freq: Every day | ORAL | 3 refills | Status: DC

## 2021-08-30 NOTE — Assessment & Plan Note (Signed)
BP Readings from Last 1 Encounters:  08/30/21 (!) 158/90   Uncontrolled with Maxzide Added Amlodipine 5 mg QD Counseled for compliance with the medications Advised DASH diet and moderate exercise/walking as tolerated

## 2021-08-30 NOTE — Patient Instructions (Signed)
Please start taking Amlodipine as prescribed.  Please continue taking other medications as prescribed.  Please continue to follow low salt diet and perform moderate exercise/walking at least 150 mins/week.

## 2021-08-30 NOTE — Assessment & Plan Note (Signed)
Had tendinitis of rotator cuff, had joint injection Now improved

## 2021-08-30 NOTE — Assessment & Plan Note (Signed)
Lab Results  Component Value Date   TSH 3.130 03/01/2021   On levothyroxine 75 mcg daily 

## 2021-08-30 NOTE — Progress Notes (Signed)
Established Patient Office Visit  Subjective:  Patient ID: Brittany Diaz, female    DOB: Nov 22, 1950  Age: 71 y.o. MRN: [id number]  CC:  Chief Complaint  Patient presents with   Follow-up    6 month follow up, no complaints states she feels good.     HPI Brittany Diaz is a 71 y.o. female with past medical history of HTN, hypothyroidism, osteopenia and generalized OA who presents for f/u of her chronic medical conditions.  HTN: BP is uncontrolled. Takes medications regularly. Patient denies headache, dizziness, chest pain, dyspnea or palpitations.  Hypothyroidism: She has been taking levothyroxine regularly. Denies any recent change in appetite or weight, tremors, vision problems, or recent hair changes.      Past Medical History:  Diagnosis Date   Hypertension    Thyroid disease     Past Surgical History:  Procedure Laterality Date   ABDOMINAL HYSTERECTOMY     CESAREAN SECTION     ELBOW SURGERY      Family History  Problem Relation Age of Onset   Thyroid disease Mother    Hypertension Mother    Hypertension Father    Diabetes Father    Breast cancer Daughter     Social History   Socioeconomic History   Marital status: Divorced    Spouse name: Not on file   Number of children: Not on file   Years of education: Not on file   Highest education level: Not on file  Occupational History   Not on file  Tobacco Use   Smoking status: Never   Smokeless tobacco: Never  Substance and Sexual Activity   Alcohol use: No   Drug use: No   Sexual activity: Never    Partners: Male    Birth control/protection: None  Other Topics Concern   Not on file  Social History Narrative   Not on file   Social Determinants of Health   Financial Resource Strain: Low Risk  (12/13/2020)   Overall Financial Resource Strain (CARDIA)    Difficulty of Paying Living Expenses: Not hard at all  Food Insecurity: No Food Insecurity (12/13/2020)   Hunger Vital Sign    Worried  About Running Out of Food in the Last Year: Never true    [place] in the Last Year: Never true  Transportation Needs: No Transportation Needs (12/13/2020)   PRAPARE - Hydrologist (Medical): No    Lack of Transportation (Non-Medical): No  Physical Activity: Insufficiently Active (12/13/2020)   Exercise Vital Sign    Days of Exercise per Week: 3 days    Minutes of Exercise per Session: 30 min  Stress: No Stress Concern Present (12/13/2020)   [place]    Feeling of Stress : Not at all  Social Connections: Moderately Integrated (12/13/2020)   Social Connection and Isolation Panel [NHANES]    Frequency of Communication with Friends and Family: More than three times a week    Frequency of Social Gatherings with Friends and Family: More than three times a week    Attends Religious Services: More than 4 times per year    Active Member of [employer] or Organizations: Yes    Attends Archivist Meetings: 1 to 4 times per year    Marital Status: Divorced  Intimate Partner Violence: Not At Risk (12/13/2020)   Humiliation, Afraid, Rape, and Kick questionnaire    Fear of Current or Ex-Partner:  No    Emotionally Abused: No    Physically Abused: No    Sexually Abused: No    Outpatient Medications Prior to Visit  Medication Sig Dispense Refill   albuterol (PROVENTIL HFA;VENTOLIN HFA) 108 (90 Base) MCG/ACT inhaler Inhale 2 puffs into the lungs every 4 (four) hours as needed. 1 Inhaler 0   celecoxib (CELEBREX) 100 MG capsule Take 1 capsule (100 mg total) by mouth daily. 30 capsule 0   cyclobenzaprine (FLEXERIL) 10 MG tablet Take 1 tablet (10 mg total) by mouth 2 (two) times daily as needed for muscle spasms. 20 tablet 0   diclofenac Sodium (VOLTAREN) 1 % GEL Apply 2 g topically 4 (four) times daily as needed. For right hand pain prn. 50 g 1   EPINEPHrine 0.3 mg/0.3 mL IJ SOAJ injection Inject 0.3  mg into the muscle as needed for anaphylaxis. 1 each 1   levothyroxine (SYNTHROID) 75 MCG tablet TAKE 1 TABLET EVERY DAY 90 tablet 2   triamcinolone cream (KENALOG) 0.1 % Apply 1 application topically 2 (two) times daily. X 7-10 days 135 each 0   triamterene-hydrochlorothiazide (MAXZIDE-25) 37.5-25 MG tablet TAKE 1 TABLET EVERY DAY 90 tablet 1   Vitamin D, Ergocalciferol, (DRISDOL) 1.25 MG (50000 UNIT) CAPS capsule Take 1 capsule (50,000 Units total) by mouth every 7 (seven) days. 12 capsule 1   No facility-administered medications prior to visit.    No Known Allergies  ROS Review of Systems  Constitutional:  Negative for chills and fever.  HENT:  Negative for congestion, sinus pressure, sinus pain and sore throat.   Eyes:  Negative for pain and discharge.  Respiratory:  Negative for cough and shortness of breath.   Cardiovascular:  Negative for chest pain and palpitations.  Gastrointestinal:  Negative for abdominal pain, diarrhea, nausea and vomiting.  Endocrine: Negative for polydipsia and polyuria.  Genitourinary:  Negative for dysuria and hematuria.  Musculoskeletal:  Positive for back pain. Negative for neck pain and neck stiffness.  Skin:  Negative for rash.  Neurological:  Negative for dizziness and weakness.  Psychiatric/Behavioral:  Negative for agitation and behavioral problems.       Objective:    Physical Exam Vitals reviewed.  Constitutional:      General: She is not in acute distress.    Appearance: She is not diaphoretic.  HENT:     Head: Normocephalic and atraumatic.     Nose: Nose normal.     Mouth/Throat:     Mouth: Mucous membranes are moist.  Eyes:     General: No scleral icterus.    Extraocular Movements: Extraocular movements intact.  Cardiovascular:     Rate and Rhythm: Normal rate and regular rhythm.     Pulses: Normal pulses.     Heart sounds: Normal heart sounds. No murmur heard. Pulmonary:     Breath sounds: Normal breath sounds. No wheezing  or rales.  Musculoskeletal:        General: No tenderness.     Cervical back: Neck supple. No tenderness.     Right lower leg: No edema.     Left lower leg: No edema.  Skin:    General: Skin is warm.     Findings: No rash.  Neurological:     General: No focal deficit present.     Mental Status: She is alert and oriented to person, place, and time.     Sensory: No sensory deficit.     Motor: No weakness.  Psychiatric:  Mood and Affect: Mood normal.        Behavior: Behavior normal.     BP (!) 158/90 (BP Location: Left Arm, Cuff Size: Normal)   Pulse 67   Ht '5\' 2"'  (1.575 m)   Wt 202 lb (91.6 kg)   SpO2 94%   BMI 36.95 kg/m  Wt Readings from Last 3 Encounters:  08/30/21 202 lb (91.6 kg)  08/21/21 204 lb (92.5 kg)  07/20/21 203 lb 6.4 oz (92.3 kg)    Lab Results  Component Value Date   TSH 3.130 03/01/2021   Lab Results  Component Value Date   WBC 7.0 03/01/2021   HGB 13.3 03/01/2021   HCT 40.1 03/01/2021   MCV 84 03/01/2021   PLT 345 03/01/2021   Lab Results  Component Value Date   NA 141 03/01/2021   K 4.5 03/01/2021   CO2 26 03/01/2021   GLUCOSE 111 (H) 03/01/2021   BUN 12 03/01/2021   CREATININE 1.03 (H) 03/01/2021   BILITOT 0.3 03/01/2021   ALKPHOS 97 03/01/2021   AST 20 03/01/2021   ALT 18 03/01/2021   PROT 7.3 03/01/2021   ALBUMIN 4.3 03/01/2021   CALCIUM 9.9 03/01/2021   EGFR 59 (L) 03/01/2021   Lab Results  Component Value Date   CHOL 203 (H) 03/01/2021   Lab Results  Component Value Date   HDL 45 03/01/2021   Lab Results  Component Value Date   LDLCALC 138 (H) 03/01/2021   Lab Results  Component Value Date   TRIG 108 03/01/2021   Lab Results  Component Value Date   CHOLHDL 4.5 (H) 03/01/2021   No results found for: "HGBA1C"    Assessment & Plan:   Problem List Items Addressed This Visit       Cardiovascular and Mediastinum   Essential hypertension - Primary    BP Readings from Last 1 Encounters:  08/30/21 (!)  158/90  Uncontrolled with Maxzide Added Amlodipine 5 mg QD Counseled for compliance with the medications Advised DASH diet and moderate exercise/walking as tolerated      Relevant Medications   amLODipine (NORVASC) 5 MG tablet     Endocrine   Hypothyroidism    Lab Results  Component Value Date   TSH 3.130 03/01/2021  On levothyroxine 75 mcg daily        Other   Chronic right shoulder pain    Had tendinitis of rotator cuff, had joint injection Now improved       Meds ordered this encounter  Medications   DISCONTD: amLODipine (NORVASC) 5 MG tablet    Sig: Take 1 tablet (5 mg total) by mouth daily.    Dispense:  30 tablet    Refill:  3   amLODipine (NORVASC) 5 MG tablet    Sig: Take 1 tablet (5 mg total) by mouth daily.    Dispense:  90 tablet    Refill:  3    Follow-up: Return in about 2 months (around 10/31/2021) for HTN.    Lindell Spar, MD

## 2021-10-09 ENCOUNTER — Other Ambulatory Visit: Payer: Self-pay | Admitting: Internal Medicine

## 2021-10-09 DIAGNOSIS — Z1231 Encounter for screening mammogram for malignant neoplasm of breast: Secondary | ICD-10-CM

## 2021-10-12 ENCOUNTER — Ambulatory Visit (HOSPITAL_COMMUNITY)
Admission: RE | Admit: 2021-10-12 | Discharge: 2021-10-12 | Disposition: A | Discharge status: Home / Self Care | Payer: Medicare HMO | Source: Ambulatory Visit | Attending: Internal Medicine | Admitting: Internal Medicine

## 2021-10-12 ENCOUNTER — Other Ambulatory Visit: Payer: Self-pay | Admitting: Family Medicine

## 2021-10-12 ENCOUNTER — Encounter: Payer: Self-pay | Admitting: Internal Medicine

## 2021-10-12 ENCOUNTER — Ambulatory Visit (INDEPENDENT_AMBULATORY_CARE_PROVIDER_SITE_OTHER): Payer: Medicare HMO | Admitting: Internal Medicine

## 2021-10-12 VITALS — BP 128/84 | HR 75 | Ht 63.5 in | Wt 199.4 lb

## 2021-10-12 DIAGNOSIS — M25552 Pain in left hip: Secondary | ICD-10-CM

## 2021-10-12 DIAGNOSIS — M1610 Unilateral primary osteoarthritis, unspecified hip: Secondary | ICD-10-CM | POA: Insufficient documentation

## 2021-10-12 NOTE — Assessment & Plan Note (Addendum)
Her description of pain and physical exam findings seem most consistent with greater trochanteric bursitis, however she did have some exam findings concerning for possible joint pathology.  We discussed conservative treatment options for now, including use of Tylenol/ibuprofen, topical NSAID use, alternating heating/icing, and home exercises.  I have also ordered an x-ray of the left hip today to assess for arthritis.  If her symptoms do not improve with conservative treatment measures, we will refer to sports medicine for further management.

## 2021-10-12 NOTE — Patient Instructions (Signed)
It was a pleasure to see you today.  Thank you for giving [place] the opportunity to be involved in your care.  Below is a brief recap of your visit and next steps.  We will plan to see you again on 10/31/21  Summary We are getting xrays of your hip and I have provided stretching exercises as well. You can continue tylenol / ibuprofen use and I recommend voltaren gel, alternating heat / ice.  Next steps You have follow up scheduled with Dr. Allena Katz on 9/20. Please let us know if your pain does not improve.  I will notify you of xray results.

## 2021-10-12 NOTE — Progress Notes (Signed)
   Acute Office Visit  Subjective:     Patient ID: Brittany Diaz, female    DOB: [birth year], 71 y.o.   MRN: [id number]  Chief Complaint  Patient presents with   Leg Pain    Left leg pain started 09/11/2021 mostly at night    Brittany Diaz is a 71 year old woman presenting today for an acute visit for evaluation of left hip pain.  She reports a 15-month history of left hip pain.  Pain is the lateral and anterior regions of her hip.  There was no inciting event or trauma at the onset of pain 2 months ago.  Her pain is most noticeable at night when lying on her left side.  She is found no alleviating factors, but does note that she has not had any pain for the last 2 days.  There are no specific movements that incite her pain, aside from lying on her left side.  She has been taking Excedrin for pain relief.  She denies pain in her left hip and her lower back.  She additionally denies red flag symptoms of fever/chills, unintentional weight loss, night sweats, and saddle anesthesia.  Review of Systems  Musculoskeletal:        Left hip pain      Objective:    BP 128/84   Pulse 75   Ht 5' 3.5" (1.613 m)   Wt 199 lb 6.4 oz (90.4 kg)   SpO2 96%   BMI 34.77 kg/m   Physical Exam Vitals reviewed.  Musculoskeletal:     Comments: No obvious deformity on inspection of the left hip.  There is tenderness to palpation over the lateral hip at the site of the greater trochanteric bursa.  She endorses anterior hip pain with forward flexion and extension.  No pain with abduction/abduction.  Positive logroll and pain elicited with FABER/FADIR testing.       Assessment & Plan:   Problem List Items Addressed This Visit       Other   Left hip pain - Primary    Her description of pain and physical exam findings seem most consistent with greater trochanteric bursitis, however she did have some exam findings concerning for possible joint pathology.  We discussed conservative treatment options for now,  including use of Tylenol/ibuprofen, topical NSAID use, alternating heating/icing, and home exercises.  I have also ordered an x-ray of the left hip today to assess for arthritis.  If her symptoms do not improve with conservative treatment measures, we will refer to sports medicine for further management.      Relevant Orders   DG Hip Unilat W OR W/O Pelvis 2-3 Views Left    Return if symptoms worsen or fail to improve.  Brittany Lade, MD

## 2021-10-31 ENCOUNTER — Ambulatory Visit (INDEPENDENT_AMBULATORY_CARE_PROVIDER_SITE_OTHER): Payer: Medicare HMO | Admitting: Internal Medicine

## 2021-10-31 ENCOUNTER — Encounter: Payer: Self-pay | Admitting: Internal Medicine

## 2021-10-31 VITALS — BP 138/86 | HR 74 | Resp 18 | Ht 62.0 in | Wt 201.4 lb

## 2021-10-31 DIAGNOSIS — E782 Mixed hyperlipidemia: Secondary | ICD-10-CM

## 2021-10-31 DIAGNOSIS — M8580 Other specified disorders of bone density and structure, unspecified site: Secondary | ICD-10-CM

## 2021-10-31 DIAGNOSIS — E559 Vitamin D deficiency, unspecified: Secondary | ICD-10-CM

## 2021-10-31 DIAGNOSIS — E039 Hypothyroidism, unspecified: Secondary | ICD-10-CM

## 2021-10-31 DIAGNOSIS — L235 Allergic contact dermatitis due to other chemical products: Secondary | ICD-10-CM | POA: Diagnosis not present

## 2021-10-31 DIAGNOSIS — M159 Polyosteoarthritis, unspecified: Secondary | ICD-10-CM

## 2021-10-31 DIAGNOSIS — R7303 Prediabetes: Secondary | ICD-10-CM

## 2021-10-31 DIAGNOSIS — Z23 Encounter for immunization: Secondary | ICD-10-CM | POA: Diagnosis not present

## 2021-10-31 DIAGNOSIS — I10 Essential (primary) hypertension: Secondary | ICD-10-CM | POA: Diagnosis not present

## 2021-10-31 DIAGNOSIS — L259 Unspecified contact dermatitis, unspecified cause: Secondary | ICD-10-CM | POA: Insufficient documentation

## 2021-10-31 NOTE — Assessment & Plan Note (Signed)
Lab Results  Component Value Date   TSH 3.130 03/01/2021   On levothyroxine 75 mcg daily 

## 2021-10-31 NOTE — Assessment & Plan Note (Signed)
BP Readings from Last 1 Encounters:  10/31/21 138/86   Well-controlled with Maxzide 37.5-25 mg and Amlodipine 5 mg QD Counseled for compliance with the medications Advised DASH diet and moderate exercise/walking as tolerated

## 2021-10-31 NOTE — Assessment & Plan Note (Signed)
Last vitamin D Lab Results  Component Value Date   VD25OH 15.3 (L) 03/01/2021   On vitamin D 50,000 IU qw

## 2021-10-31 NOTE — Assessment & Plan Note (Signed)
Uses Voltaren gel over knees and hands 

## 2021-10-31 NOTE — Patient Instructions (Addendum)
Please continue taking medications as prescribed.  Please continue to follow low salt diet and perform moderate exercise/walking as tolerated.  Please consider getting Tdap vaccine at local pharmacy. 

## 2021-10-31 NOTE — Progress Notes (Signed)
Established Patient Office Visit  Subjective:  Patient ID: Brittany Diaz, female    DOB: [birth year]  Age: 71 y.o. MRN: [id number]  CC:  Chief Complaint  Patient presents with   Follow-up    2 month follow up HTN pt has dry spots popping up on arms just noticed     HPI Brittany Diaz is a 71 y.o. female with past medical history of HTN, hypothyroidism, osteopenia and generalized OA who presents for f/u of her chronic medical conditions.  HTN: Her BP is well controlled now.  She has been taking Maxide and amlodipine regularly.  She denies any headache, dizziness, chest pain, dyspnea or palpitations.  Her left hip pain has improved now.  She had likely bursitis, which has now resolved with Tylenol and Voltaren gel.  She reports peeling of skin from bilateral hands.  She thinks it is due to her dishwasher liquid or detergent.  She denies any other injury, bleeding or discharge from the site.     Past Medical History:  Diagnosis Date   Hypertension    Thyroid disease     Past Surgical History:  Procedure Laterality Date   ABDOMINAL HYSTERECTOMY     CESAREAN SECTION     ELBOW SURGERY      Family History  Problem Relation Age of Onset   Thyroid disease Mother    Hypertension Mother    Hypertension Father    Diabetes Father    Breast cancer Daughter     Social History   Socioeconomic History   Marital status: Divorced    Spouse name: Not on file   Number of children: Not on file   Years of education: Not on file   Highest education level: Not on file  Occupational History   Not on file  Tobacco Use   Smoking status: Never   Smokeless tobacco: Never  Substance and Sexual Activity   Alcohol use: No   Drug use: No   Sexual activity: Never    Partners: Male    Birth control/protection: None  Other Topics Concern   Not on file  Social History Narrative   Not on file   Social Determinants of Health   Financial Resource Strain: Low Risk  (12/13/2020)    Overall Financial Resource Strain (CARDIA)    Difficulty of Paying Living Expenses: Not hard at all  Food Insecurity: No Food Insecurity (12/13/2020)   Hunger Vital Sign    Worried About Running Out of Food in the Last Year: Never true    [place] in the Last Year: Never true  Transportation Needs: No Transportation Needs (12/13/2020)   PRAPARE - Hydrologist (Medical): No    Lack of Transportation (Non-Medical): No  Physical Activity: Insufficiently Active (12/13/2020)   Exercise Vital Sign    Days of Exercise per Week: 3 days    Minutes of Exercise per Session: 30 min  Stress: No Stress Concern Present (12/13/2020)   [place]    Feeling of Stress : Not at all  Social Connections: Moderately Integrated (12/13/2020)   Social Connection and Isolation Panel [NHANES]    Frequency of Communication with Friends and Family: More than three times a week    Frequency of Social Gatherings with Friends and Family: More than three times a week    Attends Religious Services: More than 4 times per year    Active Member of [employer]  or Organizations: Yes    Attends Archivist Meetings: 1 to 4 times per year    Marital Status: Divorced  Intimate Partner Violence: Not At Risk (12/13/2020)   Humiliation, Afraid, Rape, and Kick questionnaire    Fear of Current or Ex-Partner: No    Emotionally Abused: No    Physically Abused: No    Sexually Abused: No    Outpatient Medications Prior to Visit  Medication Sig Dispense Refill   albuterol (PROVENTIL HFA;VENTOLIN HFA) 108 (90 Base) MCG/ACT inhaler Inhale 2 puffs into the lungs every 4 (four) hours as needed. 1 Inhaler 0   amLODipine (NORVASC) 5 MG tablet Take 1 tablet (5 mg total) by mouth daily. 90 tablet 3   celecoxib (CELEBREX) 100 MG capsule Take 1 capsule (100 mg total) by mouth daily. 30 capsule 0   cyclobenzaprine (FLEXERIL) 10 MG tablet Take 1  tablet (10 mg total) by mouth 2 (two) times daily as needed for muscle spasms. 20 tablet 0   diclofenac Sodium (VOLTAREN) 1 % GEL Apply 2 g topically 4 (four) times daily as needed. For right hand pain prn. 50 g 1   EPINEPHrine 0.3 mg/0.3 mL IJ SOAJ injection Inject 0.3 mg into the muscle as needed for anaphylaxis. 1 each 1   levothyroxine (SYNTHROID) 75 MCG tablet TAKE 1 TABLET EVERY DAY 90 tablet 2   triamcinolone cream (KENALOG) 0.1 % Apply 1 application topically 2 (two) times daily. X 7-10 days 135 each 0   triamterene-hydrochlorothiazide (MAXZIDE-25) 37.5-25 MG tablet TAKE 1 TABLET EVERY DAY 90 tablet 1   Vitamin D, Ergocalciferol, (DRISDOL) 1.25 MG (50000 UNIT) CAPS capsule Take 1 capsule (50,000 Units total) by mouth every 7 (seven) days. 12 capsule 1   No facility-administered medications prior to visit.    No Known Allergies  ROS Review of Systems  Constitutional:  Negative for chills and fever.  HENT:  Negative for congestion, sinus pressure, sinus pain and sore throat.   Eyes:  Negative for pain and discharge.  Respiratory:  Negative for cough and shortness of breath.   Cardiovascular:  Negative for chest pain and palpitations.  Gastrointestinal:  Negative for abdominal pain, diarrhea, nausea and vomiting.  Endocrine: Negative for polydipsia and polyuria.  Genitourinary:  Negative for dysuria and hematuria.  Musculoskeletal:  Positive for back pain. Negative for neck pain and neck stiffness.  Skin:  Positive for rash.  Neurological:  Negative for dizziness and weakness.  Psychiatric/Behavioral:  Negative for agitation and behavioral problems.       Objective:    Physical Exam Vitals reviewed.  Constitutional:      General: She is not in acute distress.    Appearance: She is not diaphoretic.  HENT:     Head: Normocephalic and atraumatic.     Nose: Nose normal.     Mouth/Throat:     Mouth: Mucous membranes are moist.  Eyes:     General: No scleral icterus.     Extraocular Movements: Extraocular movements intact.  Cardiovascular:     Rate and Rhythm: Normal rate and regular rhythm.     Pulses: Normal pulses.     Heart sounds: Normal heart sounds. No murmur heard. Pulmonary:     Breath sounds: Normal breath sounds. No wheezing or rales.  Musculoskeletal:        General: No tenderness.     Cervical back: Neck supple. No tenderness.     Right lower leg: No edema.     Left lower leg:  No edema.  Skin:    General: Skin is warm.     Findings: Rash (Skin erosion sports over b/l hands) present.  Neurological:     General: No focal deficit present.     Mental Status: She is alert and oriented to person, place, and time.     Sensory: No sensory deficit.     Motor: No weakness.  Psychiatric:        Mood and Affect: Mood normal.        Behavior: Behavior normal.     BP 138/86 (BP Location: Right Arm, Patient Position: Sitting, Cuff Size: Normal)   Pulse 74   Resp 18   Ht '5\' 2"'  (1.575 m)   Wt 201 lb 6.4 oz (91.4 kg)   SpO2 97%   BMI 36.84 kg/m  Wt Readings from Last 3 Encounters:  10/31/21 201 lb 6.4 oz (91.4 kg)  10/12/21 199 lb 6.4 oz (90.4 kg)  08/30/21 202 lb (91.6 kg)    Lab Results  Component Value Date   TSH 3.130 03/01/2021   Lab Results  Component Value Date   WBC 7.0 03/01/2021   HGB 13.3 03/01/2021   HCT 40.1 03/01/2021   MCV 84 03/01/2021   PLT 345 03/01/2021   Lab Results  Component Value Date   NA 141 03/01/2021   K 4.5 03/01/2021   CO2 26 03/01/2021   GLUCOSE 111 (H) 03/01/2021   BUN 12 03/01/2021   CREATININE 1.03 (H) 03/01/2021   BILITOT 0.3 03/01/2021   ALKPHOS 97 03/01/2021   AST 20 03/01/2021   ALT 18 03/01/2021   PROT 7.3 03/01/2021   ALBUMIN 4.3 03/01/2021   CALCIUM 9.9 03/01/2021   EGFR 59 (L) 03/01/2021   Lab Results  Component Value Date   CHOL 203 (H) 03/01/2021   Lab Results  Component Value Date   HDL 45 03/01/2021   Lab Results  Component Value Date   LDLCALC 138 (H) 03/01/2021    Lab Results  Component Value Date   TRIG 108 03/01/2021   Lab Results  Component Value Date   CHOLHDL 4.5 (H) 03/01/2021   No results found for: "HGBA1C"    Assessment & Plan:   Problem List Items Addressed This Visit       Cardiovascular and Mediastinum   Essential hypertension - Primary    BP Readings from Last 1 Encounters:  10/31/21 138/86  Well-controlled with Maxzide 37.5-25 mg and Amlodipine 5 mg QD Counseled for compliance with the medications Advised DASH diet and moderate exercise/walking as tolerated      Relevant Orders   CMP14+EGFR   CBC with Differential/Platelet     Endocrine   Hypothyroidism    Lab Results  Component Value Date   TSH 3.130 03/01/2021  On levothyroxine 75 mcg daily      Relevant Orders   TSH + free T4     Musculoskeletal and Integument   Osteopenia    Last DEXA scan reviewed from 2021 Advised to start taking calcium and vitamin D supplements.      Relevant Orders   DG Bone Density   Generalized OA    Uses Voltaren gel over knees and hands      Allergic dermatitis    Peeling of skin from hand likely due to contact dermatitis from chemical exposure-likely detergent or dishwasher liquid Advised to change chemical Can use gloves to avoid direct exposure Apply lotion or moisturizer Can use cortisone cream as needed  Relevant Orders   CBC with Differential/Platelet     Other   Vitamin D deficiency    Last vitamin D Lab Results  Component Value Date   VD25OH 15.3 (L) 03/01/2021  On vitamin D 50,000 IU qw      Relevant Orders   VITAMIN D 25 Hydroxy (Vit-D Deficiency, Fractures)   Other Visit Diagnoses     Mixed hyperlipidemia       Relevant Orders   Lipid panel   Prediabetes       Relevant Orders   Hemoglobin A1c   Need for immunization against influenza       Relevant Orders   Flu Vaccine QUAD High Dose(Fluad) (Completed)       No orders of the defined types were placed in this  encounter.   Follow-up: Return in about 4 months (around 03/02/2022) for Annual physical.    Lindell Spar, MD

## 2021-10-31 NOTE — Assessment & Plan Note (Signed)
Last DEXA scan reviewed from 2021 Advised to start taking calcium and vitamin D supplements.

## 2021-10-31 NOTE — Assessment & Plan Note (Signed)
Peeling of skin from hand likely due to contact dermatitis from chemical exposure-likely detergent or dishwasher liquid Advised to change chemical Can use gloves to avoid direct exposure Apply lotion or moisturizer Can use cortisone cream as needed

## 2021-11-09 ENCOUNTER — Ambulatory Visit (HOSPITAL_COMMUNITY)
Admission: RE | Admit: 2021-11-09 | Discharge: 2021-11-09 | Disposition: A | Discharge status: Home / Self Care | Payer: Medicare HMO | Source: Ambulatory Visit | Attending: Internal Medicine | Admitting: Internal Medicine

## 2021-11-09 DIAGNOSIS — E0590 Thyrotoxicosis, unspecified without thyrotoxic crisis or storm: Secondary | ICD-10-CM | POA: Diagnosis not present

## 2021-11-09 DIAGNOSIS — Z780 Asymptomatic menopausal state: Secondary | ICD-10-CM | POA: Insufficient documentation

## 2021-11-09 DIAGNOSIS — M8580 Other specified disorders of bone density and structure, unspecified site: Secondary | ICD-10-CM

## 2021-11-09 DIAGNOSIS — Z13820 Encounter for screening for osteoporosis: Secondary | ICD-10-CM | POA: Insufficient documentation

## 2021-11-19 ENCOUNTER — Encounter: Payer: Self-pay | Admitting: Internal Medicine

## 2021-11-26 ENCOUNTER — Ambulatory Visit
Admission: RE | Admit: 2021-11-26 | Discharge: 2021-11-26 | Disposition: A | Discharge status: Home / Self Care | Payer: Medicare HMO | Source: Ambulatory Visit | Attending: Internal Medicine | Admitting: Internal Medicine

## 2021-11-26 DIAGNOSIS — Z1231 Encounter for screening mammogram for malignant neoplasm of breast: Secondary | ICD-10-CM

## 2022-01-02 ENCOUNTER — Ambulatory Visit (INDEPENDENT_AMBULATORY_CARE_PROVIDER_SITE_OTHER): Payer: Medicare HMO

## 2022-01-02 DIAGNOSIS — Z0000 Encounter for general adult medical examination without abnormal findings: Secondary | ICD-10-CM | POA: Diagnosis not present

## 2022-01-02 NOTE — Patient Instructions (Signed)
  Brittany Diaz , Thank you for taking time to come for your Medicare Wellness Visit. I appreciate your ongoing commitment to your health goals. Please review the following plan we discussed and let me know if I can assist you in the future.   These are the goals we discussed:  Goals      Patient Stated     Finish swim classes at the [facility].     Patient Stated     Patient states that her goal is to learn how to swim        This is a list of the screening recommended for you and due dates:  Health Maintenance  Topic Date Due   COVID-19 Vaccine (3 - Moderna risk series) 03/02/2020   Zoster (Shingles) Vaccine (2 of 2) 12/19/2021   Medicare Annual Wellness Visit  01/03/2023   Mammogram  11/27/2023   Cologuard (Stool DNA test)  03/08/2024   Pneumonia Vaccine  Completed   Flu Shot  Completed   DEXA scan (bone density measurement)  Completed   Hepatitis C Screening: USPSTF Recommendation to screen - Ages 83-79 yo.  Completed   HPV Vaccine  Aged Out   Colon Cancer Screening  Discontinued

## 2022-01-02 NOTE — Progress Notes (Signed)
Subjective:   Brittany Diaz is a 71 y.o. female who presents for Medicare Annual (Subsequent) preventive examination. I connected with  Brittany Diaz on 01/02/22 by a audio enabled telemedicine application and verified that I am speaking with the correct person using two identifiers.  Patient Location: Home  Provider Location: Office/Clinic  I discussed the limitations of evaluation and management by telemedicine. The patient expressed understanding and agreed to proceed.  Review of Systems           Objective:    There were no vitals filed for this visit. There is no height or weight on file to calculate BMI.     12/13/2020    2:04 PM  Advanced Directives  Does Patient Have a Medical Advance Directive? No;Yes  Type of Advance Directive Healthcare Power of Attorney  Does patient want to make changes to medical advance directive? Yes (ED - Information included in AVS)  Copy of Healthcare Power of Attorney in Chart? No - copy requested  Would patient like information on creating a medical advance directive? Yes (MAU/Ambulatory/Procedural Areas - Information given)    Current Medications (verified) Outpatient Encounter Medications as of 01/02/2022  Medication Sig   albuterol (PROVENTIL HFA;VENTOLIN HFA) 108 (90 Base) MCG/ACT inhaler Inhale 2 puffs into the lungs every 4 (four) hours as needed.   amLODipine (NORVASC) 5 MG tablet Take 1 tablet (5 mg total) by mouth daily.   celecoxib (CELEBREX) 100 MG capsule Take 1 capsule (100 mg total) by mouth daily.   cyclobenzaprine (FLEXERIL) 10 MG tablet Take 1 tablet (10 mg total) by mouth 2 (two) times daily as needed for muscle spasms.   diclofenac Sodium (VOLTAREN) 1 % GEL Apply 2 g topically 4 (four) times daily as needed. For right hand pain prn.   EPINEPHrine 0.3 mg/0.3 mL IJ SOAJ injection Inject 0.3 mg into the muscle as needed for anaphylaxis.   levothyroxine (SYNTHROID) 75 MCG tablet TAKE 1 TABLET EVERY DAY    triamcinolone cream (KENALOG) 0.1 % Apply 1 application topically 2 (two) times daily. X 7-10 days   triamterene-hydrochlorothiazide (MAXZIDE-25) 37.5-25 MG tablet TAKE 1 TABLET EVERY DAY   Vitamin D, Ergocalciferol, (DRISDOL) 1.25 MG (50000 UNIT) CAPS capsule Take 1 capsule (50,000 Units total) by mouth every 7 (seven) days.   No facility-administered encounter medications on file as of 01/02/2022.    Allergies (verified) Patient has no known allergies.   History: Past Medical History:  Diagnosis Date   Hypertension    Thyroid disease    Past Surgical History:  Procedure Laterality Date   ABDOMINAL HYSTERECTOMY     CESAREAN SECTION     ELBOW SURGERY     Family History  Problem Relation Age of Onset   Thyroid disease Mother    Hypertension Mother    Hypertension Father    Diabetes Father    Breast cancer Daughter    Social History   Socioeconomic History   Marital status: Divorced    Spouse name: Not on file   Number of children: Not on file   Years of education: Not on file   Highest education level: Not on file  Occupational History   Not on file  Tobacco Use   Smoking status: Never   Smokeless tobacco: Never  Substance and Sexual Activity   Alcohol use: No   Drug use: No   Sexual activity: Never    Partners: Male    Birth control/protection: None  Other Topics Concern  Not on file  Social History Narrative   Not on file   Social Determinants of Health   Financial Resource Strain: Low Risk  (12/13/2020)   Overall Financial Resource Strain (CARDIA)    Difficulty of Paying Living Expenses: Not hard at all  Food Insecurity: No Food Insecurity (12/13/2020)   Hunger Vital Sign    Worried About Running Out of Food in the Last Year: Never true    Ran Out of Food in the Last Year: Never true  Transportation Needs: No Transportation Needs (12/13/2020)   PRAPARE - Administrator, Civil Service (Medical): No    Lack of Transportation (Non-Medical): No   Physical Activity: Insufficiently Active (12/13/2020)   Exercise Vital Sign    Days of Exercise per Week: 3 days    Minutes of Exercise per Session: 30 min  Stress: No Stress Concern Present (12/13/2020)   [employer] of Occupational Health - Occupational Stress Questionnaire    Feeling of Stress : Not at all  Social Connections: Moderately Integrated (12/13/2020)   Social Connection and Isolation Panel [NHANES]    Frequency of Communication with Friends and Family: More than three times a week    Frequency of Social Gatherings with Friends and Family: More than three times a week    Attends Religious Services: More than 4 times per year    Active Member of [employer] or Organizations: Yes    Attends Banker Meetings: 1 to 4 times per year    Marital Status: Divorced    Tobacco Counseling Counseling given: Not Answered   Clinical Intake:                 Diabetic?no         Activities of Daily Living     No data to display          Patient Care Team: Anabel Halon, MD as PCP - General (Internal Medicine)  Indicate any recent Medical Services you may have received from other than [facility] providers in the past year (date may be approximate).     Assessment:   This is a routine wellness examination for Brittany Diaz.  Hearing/Vision screen No results found.  Dietary issues and exercise activities discussed:     Goals Addressed   None    Depression Screen    10/31/2021    9:44 AM 10/12/2021   10:53 AM 08/30/2021    8:49 AM 07/20/2021   11:16 AM 07/03/2021    1:21 PM 03/02/2021    9:07 AM 02/15/2021   10:38 AM  PHQ 2/9 Scores  PHQ - 2 Score 0 0 0 0 0 0 0    Fall Risk    10/31/2021    9:44 AM 10/12/2021   10:53 AM 08/30/2021    8:48 AM 07/20/2021   11:16 AM 07/03/2021    1:21 PM  Fall Risk   Falls in the past year? 0 0 0 0 0  Number falls in past yr: 0 0 0 0 0  Injury with Fall? 0 0 0 0 0  Risk for fall due to : No Fall Risks No Fall Risks No  Fall Risks No Fall Risks No Fall Risks  Follow up Falls evaluation completed Falls evaluation completed Falls evaluation completed Falls evaluation completed Falls evaluation completed    FALL RISK PREVENTION PERTAINING TO THE HOME:  Any stairs in or around the home? Yes  If so, are there any without handrails? No  Home  free of loose throw rugs in walkways, pet beds, electrical cords, etc? Yes  Adequate lighting in your home to reduce risk of falls? Yes   ASSISTIVE DEVICES UTILIZED TO PREVENT FALLS:  Life alert? No  Use of a cane, walker or w/c? No  Grab bars in the bathroom? Yes  Shower chair or bench in shower? No  Elevated toilet seat or a handicapped toilet? No   TIMED UP AND GO:  Was the test performed? No .  Length of time to ambulate 10 feet:  sec.     Cognitive Function:    12/13/2020    2:05 PM  MMSE - Mini Mental State Exam  Not completed: Unable to complete        12/13/2020    2:06 PM  6CIT Screen  What Year? 0 points  What month? 0 points  What time? 0 points  Count back from 20 0 points  Months in reverse 0 points  Repeat phrase 0 points  Total Score 0 points    Immunizations Immunization History  Administered Date(s) Administered   Fluad Quad(high Dose 65+) 11/23/2019, 10/27/2020, 10/31/2021   Influenza,inj,Quad PF,6+ Mos 04/30/2017, 01/28/2018   Moderna SARS-COV2 Booster Vaccination 02/03/2020   Moderna Sars-Covid-2 Vaccination 04/22/2019, 05/25/2019   Pneumococcal Conjugate-13 09/24/2018   Pneumococcal Polysaccharide-23 04/30/2017   Zoster Recombinat (Shingrix) 10/24/2021    TDAP status: Up to date  Flu Vaccine status: Up to date  Pneumococcal vaccine status: Up to date  Covid-19 vaccine status: Information provided on how to obtain vaccines.   Qualifies for Shingles Vaccine? Yes   Zostavax completed No   Shingrix Completed?: No.    Education has been provided regarding the importance of this vaccine. Patient has been advised to call  insurance company to determine out of pocket expense if they have not yet received this vaccine. Advised may also receive vaccine at local pharmacy or Health Dept. Verbalized acceptance and understanding.  Screening Tests Health Maintenance  Topic Date Due   COVID-19 Vaccine (3 - Moderna risk series) 03/02/2020   Medicare Annual Wellness (AWV)  12/13/2021   Zoster Vaccines- Shingrix (2 of 2) 12/19/2021   MAMMOGRAM  11/27/2023   Fecal DNA (Cologuard)  03/08/2024   Pneumonia Vaccine 6+ Years old  Completed   INFLUENZA VACCINE  Completed   DEXA SCAN  Completed   Hepatitis C Screening  Completed   HPV VACCINES  Aged Out   COLONOSCOPY (Pts 45-54yrs Insurance coverage will need to be confirmed)  Discontinued    Health Maintenance  Health Maintenance Due  Topic Date Due   COVID-19 Vaccine (3 - Moderna risk series) 03/02/2020   Medicare Annual Wellness (AWV)  12/13/2021   Zoster Vaccines- Shingrix (2 of 2) 12/19/2021    Colorectal cancer screening: Type of screening: Cologuard. Completed 03/08/21. Repeat every 3 years  Mammogram status: Completed 11/26/21. Repeat every year  Bone Density status: Completed 11/09/21. Results reflect: Bone density results: OSTEOPENIA. Repeat every   years.  Lung Cancer Screening: (Low Dose CT Chest recommended if Age 15-80 years, 30 pack-year currently smoking OR have quit w/in 15years.) does not qualify.   Lung Cancer Screening Referral:   Additional Screening:  Hepatitis C Screening: does not qualify; Completed 03/01/21  Vision Screening: Recommended annual ophthalmology exams for early detection of glaucoma and other disorders of the eye. Is the patient up to date with their annual eye exam?  No  Who is the provider or what is the name of the office in which  the patient attends annual eye exams? [facility] If pt is not established with a provider, would they like to be referred to a provider to establish care? No .   Dental Screening:  Recommended annual dental exams for proper oral hygiene  Community Resource Referral / Chronic Care Management: CRR required this visit?  No   CCM required this visit?  No      Plan:     I have personally reviewed and noted the following in the patient's chart:   Medical and social history Use of alcohol, tobacco or illicit drugs  Current medications and supplements including opioid prescriptions. Patient is not currently taking opioid prescriptions. Functional ability and status Nutritional status Physical activity Advanced directives List of other physicians Hospitalizations, surgeries, and ER visits in previous 12 months Vitals Screenings to include cognitive, depression, and falls Referrals and appointments  In addition, I have reviewed and discussed with patient certain preventive protocols, quality metrics, and best practice recommendations. A written personalized care plan for preventive services as well as general preventive health recommendations were provided to patient.     Harriet PhoJoliza Orlan Aversa, CMA   01/02/2022   Nurse Notes:

## 2022-03-05 ENCOUNTER — Encounter: Payer: Self-pay | Admitting: Internal Medicine

## 2022-03-05 ENCOUNTER — Ambulatory Visit (INDEPENDENT_AMBULATORY_CARE_PROVIDER_SITE_OTHER): Payer: Medicare HMO | Admitting: Internal Medicine

## 2022-03-05 VITALS — BP 162/94 | HR 80 | Ht 62.0 in | Wt 208.2 lb

## 2022-03-05 DIAGNOSIS — E039 Hypothyroidism, unspecified: Secondary | ICD-10-CM

## 2022-03-05 DIAGNOSIS — I10 Essential (primary) hypertension: Secondary | ICD-10-CM

## 2022-03-05 DIAGNOSIS — R7303 Prediabetes: Secondary | ICD-10-CM | POA: Diagnosis not present

## 2022-03-05 DIAGNOSIS — Z0001 Encounter for general adult medical examination with abnormal findings: Secondary | ICD-10-CM | POA: Diagnosis not present

## 2022-03-05 DIAGNOSIS — M159 Polyosteoarthritis, unspecified: Secondary | ICD-10-CM

## 2022-03-05 DIAGNOSIS — E559 Vitamin D deficiency, unspecified: Secondary | ICD-10-CM

## 2022-03-05 DIAGNOSIS — M25552 Pain in left hip: Secondary | ICD-10-CM

## 2022-03-05 DIAGNOSIS — E782 Mixed hyperlipidemia: Secondary | ICD-10-CM

## 2022-03-05 MED ORDER — TRIAMTERENE-HCTZ 37.5-25 MG PO TABS: 1.0000 | ORAL_TABLET | Freq: Every day | ORAL | 3 refills | Status: DC

## 2022-03-05 MED ORDER — CELECOXIB 100 MG PO CAPS: 100.0000 mg | ORAL_CAPSULE | Freq: Every day | ORAL | 3 refills | Status: DC

## 2022-03-05 MED ORDER — AMLODIPINE BESYLATE 10 MG PO TABS: 10.0000 mg | ORAL_TABLET | Freq: Every day | ORAL | 3 refills | Status: DC

## 2022-03-05 NOTE — Assessment & Plan Note (Signed)
Advised to follow cholesterol diet for now Check lipid profile

## 2022-03-05 NOTE — Assessment & Plan Note (Signed)
Likely due to bursitis X-ray of hip showed mild arthritis Needs to take Celebrex as needed Advised to apply ice over the left hip area Referred to orthopedic surgeon

## 2022-03-05 NOTE — Patient Instructions (Signed)
Please start taking Amlodipine 10 mg instead of 5 mg. Please continue taking Maxzide as prescribed.  Please continue to follow low salt diet and ambulate as tolerated.

## 2022-03-05 NOTE — Progress Notes (Signed)
Established Patient Office Visit  Subjective:  Patient ID: Brittany Diaz, female    DOB: January 18, 1951  Age: 72 y.o. MRN: [id number]  CC:  Chief Complaint  Patient presents with   Annual Exam    Patient states she doesn't sleep through the night, she will wake up at times then eventually go back to sleep    HPI Brittany Diaz is a 72 y.o. female with past medical history of HTN, hypothyroidism, osteopenia and generalized OA who presents for annual physical.  HTN: BP is elevated today. Does not take medications regularly as she forgets sometimes, but has taken them today.  She reports that she could not sleep today as her grand daughter moved in with her, who has schizophrenia. Patient denies headache, dizziness, chest pain, dyspnea or palpitations.  Hypothyroidism: She has been taking levothyroxine regularly.  She reports intermittent fatigue, but denies any recent change in appetite or weight, tremors, vision problems, or recent hair changes.  She still complains of left hip pain since 10/12/21.  She has worsening of pain at nighttime, while lying on left side.  She has tried Celebrex with some relief, but does not take it regularly.  Denies any numbness or tingling of the LE.  Past Medical History:  Diagnosis Date   Hypertension    Thyroid disease     Past Surgical History:  Procedure Laterality Date   ABDOMINAL HYSTERECTOMY     CESAREAN SECTION     ELBOW SURGERY      Family History  Problem Relation Age of Onset   Thyroid disease Mother    Hypertension Mother    Hypertension Father    Diabetes Father    Breast cancer Daughter     Social History   Socioeconomic History   Marital status: Divorced    Spouse name: Not on file   Number of children: Not on file   Years of education: Not on file   Highest education level: Not on file  Occupational History   Not on file  Tobacco Use   Smoking status: Never   Smokeless tobacco: Never  Substance and Sexual  Activity   Alcohol use: No   Drug use: No   Sexual activity: Never    Partners: Male    Birth control/protection: None  Other Topics Concern   Not on file  Social History Narrative   Not on file   Social Determinants of Health   Financial Resource Strain: Low Risk  (01/02/2022)   Overall Financial Resource Strain (CARDIA)    Difficulty of Paying Living Expenses: Not hard at all  Food Insecurity: No Food Insecurity (01/02/2022)   Hunger Vital Sign    Worried About Running Out of Food in the Last Year: Never true    Ran Out of Food in the Last Year: Never true  Transportation Needs: Unknown (01/02/2022)   PRAPARE - Administrator, Civil Service (Medical): Not on file    Lack of Transportation (Non-Medical): No  Physical Activity: Insufficiently Active (12/13/2020)   Exercise Vital Sign    Days of Exercise per Week: 3 days    Minutes of Exercise per Session: 30 min  Stress: No Stress Concern Present (01/02/2022)   [employer] of Occupational Health - Occupational Stress Questionnaire    Feeling of Stress : Not at all  Social Connections: Moderately Integrated (01/02/2022)   Social Connection and Isolation Panel [NHANES]    Frequency of Communication with Friends and Family: More than three  times a week    Frequency of Social Gatherings with Friends and Family: More than three times a week    Attends Religious Services: More than 4 times per year    Active Member of [employer] or Organizations: Yes    Attends Music therapist: More than 4 times per year    Marital Status: Divorced  Intimate Partner Violence: Not At Risk (12/13/2020)   Humiliation, Afraid, Rape, and Kick questionnaire    Fear of Current or Ex-Partner: No    Emotionally Abused: No    Physically Abused: No    Sexually Abused: No    Outpatient Medications Prior to Visit  Medication Sig Dispense Refill   albuterol (PROVENTIL HFA;VENTOLIN HFA) 108 (90 Base) MCG/ACT inhaler Inhale 2 puffs  into the lungs every 4 (four) hours as needed. 1 Inhaler 0   cyclobenzaprine (FLEXERIL) 10 MG tablet Take 1 tablet (10 mg total) by mouth 2 (two) times daily as needed for muscle spasms. 20 tablet 0   diclofenac Sodium (VOLTAREN) 1 % GEL Apply 2 g topically 4 (four) times daily as needed. For right hand pain prn. 50 g 1   EPINEPHrine 0.3 mg/0.3 mL IJ SOAJ injection Inject 0.3 mg into the muscle as needed for anaphylaxis. 1 each 1   levothyroxine (SYNTHROID) 75 MCG tablet TAKE 1 TABLET EVERY DAY 90 tablet 2   triamcinolone cream (KENALOG) 0.1 % Apply 1 application topically 2 (two) times daily. X 7-10 days (Patient not taking: Reported on 01/02/2022) 135 each 0   amLODipine (NORVASC) 5 MG tablet Take 1 tablet (5 mg total) by mouth daily. 90 tablet 3   celecoxib (CELEBREX) 100 MG capsule Take 1 capsule (100 mg total) by mouth daily. 30 capsule 0   triamterene-hydrochlorothiazide (MAXZIDE-25) 37.5-25 MG tablet TAKE 1 TABLET EVERY DAY 90 tablet 1   Vitamin D, Ergocalciferol, (DRISDOL) 1.25 MG (50000 UNIT) CAPS capsule Take 1 capsule (50,000 Units total) by mouth every 7 (seven) days. 12 capsule 1   No facility-administered medications prior to visit.    No Known Allergies  ROS Review of Systems  Constitutional:  Negative for chills and fever.  HENT:  Negative for congestion, sinus pressure, sinus pain and sore throat.   Eyes:  Negative for pain and discharge.  Respiratory:  Negative for cough and shortness of breath.   Cardiovascular:  Negative for chest pain and palpitations.  Gastrointestinal:  Negative for abdominal pain, diarrhea, nausea and vomiting.  Endocrine: Negative for polydipsia and polyuria.  Genitourinary:  Negative for dysuria and hematuria.  Musculoskeletal:  Positive for arthralgias (L hip). Negative for neck pain and neck stiffness.  Skin:  Negative for rash.  Neurological:  Negative for dizziness and weakness.  Psychiatric/Behavioral:  Negative for agitation and behavioral  problems.       Objective:    Physical Exam Vitals reviewed.  Constitutional:      General: She is not in acute distress.    Appearance: She is not diaphoretic.  HENT:     Head: Normocephalic and atraumatic.     Nose: Nose normal.     Mouth/Throat:     Mouth: Mucous membranes are moist.  Eyes:     General: No scleral icterus.    Extraocular Movements: Extraocular movements intact.  Cardiovascular:     Rate and Rhythm: Normal rate and regular rhythm.     Pulses: Normal pulses.     Heart sounds: Normal heart sounds. No murmur heard. Pulmonary:     Breath  sounds: Normal breath sounds. No wheezing or rales.  Abdominal:     Palpations: Abdomen is soft.     Tenderness: There is no abdominal tenderness.  Musculoskeletal:     Cervical back: Neck supple. No tenderness.     Left hip: Tenderness present. Decreased range of motion.     Right lower leg: No edema.     Left lower leg: No edema.  Skin:    General: Skin is warm.     Findings: No rash.  Neurological:     General: No focal deficit present.     Mental Status: She is alert and oriented to person, place, and time.     Cranial Nerves: No cranial nerve deficit.     Sensory: No sensory deficit.     Motor: No weakness.  Psychiatric:        Mood and Affect: Mood normal.        Behavior: Behavior normal.     BP (!) 162/94 (BP Location: Left Arm)   Pulse 80   Ht 5\' 2"  (1.575 m)   Wt 208 lb 3.2 oz (94.4 kg)   SpO2 96%   BMI 38.08 kg/m  Wt Readings from Last 3 Encounters:  03/05/22 208 lb 3.2 oz (94.4 kg)  10/31/21 201 lb 6.4 oz (91.4 kg)  10/12/21 199 lb 6.4 oz (90.4 kg)    Lab Results  Component Value Date   TSH 3.130 03/01/2021   Lab Results  Component Value Date   WBC 7.0 03/01/2021   HGB 13.3 03/01/2021   HCT 40.1 03/01/2021   MCV 84 03/01/2021   PLT 345 03/01/2021   Lab Results  Component Value Date   NA 141 03/01/2021   K 4.5 03/01/2021   CO2 26 03/01/2021   GLUCOSE 111 (H) 03/01/2021   BUN 12  03/01/2021   CREATININE 1.03 (H) 03/01/2021   BILITOT 0.3 03/01/2021   ALKPHOS 97 03/01/2021   AST 20 03/01/2021   ALT 18 03/01/2021   PROT 7.3 03/01/2021   ALBUMIN 4.3 03/01/2021   CALCIUM 9.9 03/01/2021   EGFR 59 (L) 03/01/2021   Lab Results  Component Value Date   CHOL 203 (H) 03/01/2021   Lab Results  Component Value Date   HDL 45 03/01/2021   Lab Results  Component Value Date   LDLCALC 138 (H) 03/01/2021   Lab Results  Component Value Date   TRIG 108 03/01/2021   Lab Results  Component Value Date   CHOLHDL 4.5 (H) 03/01/2021   No results found for: "HGBA1C"    Assessment & Plan:   Problem List Items Addressed This Visit       Cardiovascular and Mediastinum   Essential hypertension    BP Readings from Last 1 Encounters:  03/05/22 (!) 158/81  Uncontrolled with Maxzide 37.5-25 mg and Amlodipine 5 mg QD Increased dose of amlodipine to 10 mg QD Counseled for compliance with the medications Advised DASH diet and moderate exercise/walking as tolerated      Relevant Medications   amLODipine (NORVASC) 10 MG tablet   triamterene-hydrochlorothiazide (MAXZIDE-25) 37.5-25 MG tablet   Other Relevant Orders   CMP14+EGFR   CBC with Differential/Platelet     Endocrine   Hypothyroidism    Lab Results  Component Value Date   TSH 3.130 03/01/2021  On levothyroxine 75 mcg daily      Relevant Orders   TSH + free T4     Musculoskeletal and Integument   Generalized OA    Uses  Voltaren gel over knees and hands      Relevant Medications   celecoxib (CELEBREX) 100 MG capsule     Other   Vitamin D deficiency   Relevant Orders   VITAMIN D 25 Hydroxy (Vit-D Deficiency, Fractures)   Encounter for general adult medical examination with abnormal findings - Primary    Physical exam as documented. Counseling done  re healthy lifestyle involving commitment to 150 minutes exercise per week, heart healthy diet, and attaining healthy weight.The importance of adequate  sleep also discussed. Changes in health habits are decided on by the patient with goals and time frames  set for achieving them. Immunization and cancer screening needs are specifically addressed at this visit.      Relevant Orders   CMP14+EGFR   CBC with Differential/Platelet   Left hip pain    Likely due to bursitis X-ray of hip showed mild arthritis Needs to take Celebrex as needed Advised to apply ice over the left hip area Referred to orthopedic surgeon      Relevant Medications   celecoxib (CELEBREX) 100 MG capsule   Other Relevant Orders   Ambulatory referral to Orthopedic Surgery   Mixed hyperlipidemia    Advised to follow cholesterol diet for now Check lipid profile      Relevant Medications   amLODipine (NORVASC) 10 MG tablet   triamterene-hydrochlorothiazide (MAXZIDE-25) 37.5-25 MG tablet   Other Relevant Orders   Lipid panel   Other Visit Diagnoses     Prediabetes       Relevant Orders   Hemoglobin A1c       Meds ordered this encounter  Medications   celecoxib (CELEBREX) 100 MG capsule    Sig: Take 1 capsule (100 mg total) by mouth daily.    Dispense:  30 capsule    Refill:  3   amLODipine (NORVASC) 10 MG tablet    Sig: Take 1 tablet (10 mg total) by mouth daily.    Dispense:  90 tablet    Refill:  3   triamterene-hydrochlorothiazide (MAXZIDE-25) 37.5-25 MG tablet    Sig: Take 1 tablet by mouth daily.    Dispense:  90 tablet    Refill:  3    Follow-up: Return in about 3 weeks (around 03/26/2022) for HTN.    Lindell Spar, MD

## 2022-03-05 NOTE — Assessment & Plan Note (Signed)
Lab Results  Component Value Date   TSH 3.130 03/01/2021   On levothyroxine 75 mcg daily

## 2022-03-05 NOTE — Assessment & Plan Note (Signed)

## 2022-03-05 NOTE — Assessment & Plan Note (Signed)
Uses Voltaren gel over knees and hands

## 2022-03-05 NOTE — Assessment & Plan Note (Addendum)
BP Readings from Last 1 Encounters:  03/05/22 (!) 158/81   Uncontrolled with Maxzide 37.5-25 mg and Amlodipine 5 mg QD Increased dose of amlodipine to 10 mg QD Counseled for compliance with the medications Advised DASH diet and moderate exercise/walking as tolerated

## 2022-03-06 LAB — LIPID PANEL
Chol/HDL Ratio: 4.9 ratio — ABNORMAL HIGH (ref 0.0–4.4)
Cholesterol, Total: 212 mg/dL — ABNORMAL HIGH (ref 100–199)
HDL: 43 mg/dL (ref >39)
LDL Chol Calc (NIH): 131 mg/dL — ABNORMAL HIGH (ref 0–99)
Triglycerides: 215 mg/dL — ABNORMAL HIGH (ref 0–149)
VLDL Cholesterol Cal: 38 mg/dL (ref 5–40)

## 2022-03-06 LAB — CMP14+EGFR
ALT: 18 IU/L (ref 0–32)
AST: 18 IU/L (ref 0–40)
Albumin/Globulin Ratio: 1.8 (ref 1.2–2.2)
Albumin: 4.5 g/dL (ref 3.9–4.9)
Alkaline Phosphatase: 88 IU/L (ref 44–121)
BUN/Creatinine Ratio: 12 (ref 12–28)
BUN: 11 mg/dL (ref 8–27)
Bilirubin Total: 0.3 mg/dL (ref 0.0–1.2)
CO2: 23 mmol/L (ref 20–29)
Calcium: 9.7 mg/dL (ref 8.7–10.3)
Chloride: 100 mmol/L (ref 96–106)
Creatinine, Ser: 0.95 mg/dL (ref 0.57–1.00)
Globulin, Total: 2.5 g/dL (ref 1.5–4.5)
Glucose: 108 mg/dL — ABNORMAL HIGH (ref 70–99)
Potassium: 4.2 mmol/L (ref 3.5–5.2)
Sodium: 138 mmol/L (ref 134–144)
Total Protein: 7 g/dL (ref 6.0–8.5)
eGFR: 65 mL/min/{1.73_m2} (ref >59)

## 2022-03-06 LAB — CBC WITH DIFFERENTIAL/PLATELET
Basophils Absolute: 0 10*3/uL (ref 0.0–0.2)
Basos: 1 %
EOS (ABSOLUTE): 0.2 10*3/uL (ref 0.0–0.4)
Eos: 3 %
Hematocrit: 40.1 % (ref 34.0–46.6)
Hemoglobin: 13.4 g/dL (ref 11.1–15.9)
Immature Grans (Abs): 0 10*3/uL (ref 0.0–0.1)
Immature Granulocytes: 0 %
Lymphocytes Absolute: 1.6 10*3/uL (ref 0.7–3.1)
Lymphs: 24 %
MCH: 27.7 pg (ref 26.6–33.0)
MCHC: 33.4 g/dL (ref 31.5–35.7)
MCV: 83 fL (ref 79–97)
Monocytes Absolute: 0.5 10*3/uL (ref 0.1–0.9)
Monocytes: 7 %
Neutrophils Absolute: 4.3 10*3/uL (ref 1.4–7.0)
Neutrophils: 65 %
Platelets: 290 10*3/uL (ref 150–450)
RBC: 4.83 x10E6/uL (ref 3.77–5.28)
RDW: 14.3 % (ref 11.7–15.4)
WBC: 6.7 10*3/uL (ref 3.4–10.8)

## 2022-03-06 LAB — TSH+FREE T4
Free T4: 1.27 ng/dL (ref 0.82–1.77)
TSH: 2.65 u[IU]/mL (ref 0.450–4.500)

## 2022-03-06 LAB — VITAMIN D 25 HYDROXY (VIT D DEFICIENCY, FRACTURES): Vit D, 25-Hydroxy: 46.5 ng/mL (ref 30.0–100.0)

## 2022-03-06 LAB — HEMOGLOBIN A1C
Est. average glucose Bld gHb Est-mCnc: 148 mg/dL
Hgb A1c MFr Bld: 6.8 % — ABNORMAL HIGH (ref 4.8–5.6)

## 2022-04-11 ENCOUNTER — Encounter: Payer: Self-pay | Admitting: Radiology

## 2022-04-16 ENCOUNTER — Ambulatory Visit (INDEPENDENT_AMBULATORY_CARE_PROVIDER_SITE_OTHER): Payer: Medicare HMO | Admitting: Internal Medicine

## 2022-04-16 ENCOUNTER — Encounter: Payer: Self-pay | Admitting: Internal Medicine

## 2022-04-16 VITALS — BP 144/72 | HR 75 | Ht 62.0 in | Wt 197.4 lb

## 2022-04-16 DIAGNOSIS — M159 Polyosteoarthritis, unspecified: Secondary | ICD-10-CM | POA: Diagnosis not present

## 2022-04-16 DIAGNOSIS — F5101 Primary insomnia: Secondary | ICD-10-CM | POA: Insufficient documentation

## 2022-04-16 DIAGNOSIS — E782 Mixed hyperlipidemia: Secondary | ICD-10-CM | POA: Diagnosis not present

## 2022-04-16 DIAGNOSIS — E039 Hypothyroidism, unspecified: Secondary | ICD-10-CM

## 2022-04-16 DIAGNOSIS — E119 Type 2 diabetes mellitus without complications: Secondary | ICD-10-CM | POA: Insufficient documentation

## 2022-04-16 DIAGNOSIS — I10 Essential (primary) hypertension: Secondary | ICD-10-CM | POA: Diagnosis not present

## 2022-04-16 DIAGNOSIS — E1169 Type 2 diabetes mellitus with other specified complication: Secondary | ICD-10-CM | POA: Diagnosis not present

## 2022-04-16 MED ORDER — ROSUVASTATIN CALCIUM 5 MG PO TABS: 5.0000 mg | ORAL_TABLET | Freq: Every day | ORAL | 3 refills | Status: DC

## 2022-04-16 NOTE — Assessment & Plan Note (Signed)
BP Readings from Last 1 Encounters:  04/16/22 (!) 153/76   Uncontrolled with Maxzide 37.5-25 mg and Amlodipine 5 mg QD Increased dose of amlodipine to 10 mg QD, needs to start taking 10 mg dose Counseled for compliance with the medications Advised DASH diet and moderate exercise/walking as tolerated

## 2022-04-16 NOTE — Assessment & Plan Note (Signed)
Lab Results  Component Value Date   TSH 2.650 03/05/2022   On levothyroxine 75 mcg daily

## 2022-04-16 NOTE — Progress Notes (Unsigned)
Established Patient Office Visit  Subjective:  Patient ID: Brittany Diaz, female    DOB: 1950/08/28  Age: 72 y.o. MRN: [id number]  CC:  Chief Complaint  Patient presents with   Hypertension    Three week follow up    HPI Brittany Diaz is a 72 y.o. female with past medical history of HTN, hypothyroidism, osteopenia and generalized OA who presents for f/u of her chronic medical conditions.  HTN: BP is elevated today. Does not take medications regularly as she forgets sometimes, but has taken them today.  She reports that she could not sleep today as her grand daughter moved in with her, who has schizophrenia. Patient denies headache, dizziness, chest pain, dyspnea or palpitations.   Hypothyroidism: She has been taking levothyroxine regularly.  She reports intermittent fatigue, but denies any recent change in appetite or weight, tremors, vision problems, or recent hair changes.    Past Medical History:  Diagnosis Date   Hypertension    Thyroid disease     Past Surgical History:  Procedure Laterality Date   ABDOMINAL HYSTERECTOMY     CESAREAN SECTION     ELBOW SURGERY      Family History  Problem Relation Age of Onset   Thyroid disease Mother    Hypertension Mother    Hypertension Father    Diabetes Father    Breast cancer Daughter     Social History   Socioeconomic History   Marital status: Divorced    Spouse name: Not on file   Number of children: Not on file   Years of education: Not on file   Highest education level: Not on file  Occupational History   Not on file  Tobacco Use   Smoking status: Never   Smokeless tobacco: Never  Substance and Sexual Activity   Alcohol use: No   Drug use: No   Sexual activity: Never    Partners: Male    Birth control/protection: None  Other Topics Concern   Not on file  Social History Narrative   Not on file   Social Determinants of Health   Financial Resource Strain: Low Risk  (01/02/2022)   Overall  Financial Resource Strain (CARDIA)    Difficulty of Paying Living Expenses: Not hard at all  Food Insecurity: No Food Insecurity (01/02/2022)   Hunger Vital Sign    Worried About Running Out of Food in the Last Year: Never true    [place] in the Last Year: Never true  Transportation Needs: Unknown (01/02/2022)   PRAPARE - Hydrologist (Medical): Not on file    Lack of Transportation (Non-Medical): No  Physical Activity: Insufficiently Active (12/13/2020)   Exercise Vital Sign    Days of Exercise per Week: 3 days    Minutes of Exercise per Session: 30 min  Stress: No Stress Concern Present (01/02/2022)   [place]    Feeling of Stress : Not at all  Social Connections: Moderately Integrated (01/02/2022)   Social Connection and Isolation Panel [NHANES]    Frequency of Communication with Friends and Family: More than three times a week    Frequency of Social Gatherings with Friends and Family: More than three times a week    Attends Religious Services: More than 4 times per year    Active Member of [employer] or Organizations: Yes    Attends Archivist Meetings: More than 4 times per year  Marital Status: Divorced  Human resources officer Violence: Not At Risk (12/13/2020)   Humiliation, Afraid, Rape, and Kick questionnaire    Fear of Current or Ex-Partner: No    Emotionally Abused: No    Physically Abused: No    Sexually Abused: No    Outpatient Medications Prior to Visit  Medication Sig Dispense Refill   albuterol (PROVENTIL HFA;VENTOLIN HFA) 108 (90 Base) MCG/ACT inhaler Inhale 2 puffs into the lungs every 4 (four) hours as needed. 1 Inhaler 0   amLODipine (NORVASC) 10 MG tablet Take 1 tablet (10 mg total) by mouth daily. 90 tablet 3   celecoxib (CELEBREX) 100 MG capsule Take 1 capsule (100 mg total) by mouth daily. 30 capsule 3   cyclobenzaprine (FLEXERIL) 10 MG tablet Take 1  tablet (10 mg total) by mouth 2 (two) times daily as needed for muscle spasms. 20 tablet 0   diclofenac Sodium (VOLTAREN) 1 % GEL Apply 2 g topically 4 (four) times daily as needed. For right hand pain prn. 50 g 1   EPINEPHrine 0.3 mg/0.3 mL IJ SOAJ injection Inject 0.3 mg into the muscle as needed for anaphylaxis. 1 each 1   levothyroxine (SYNTHROID) 75 MCG tablet TAKE 1 TABLET EVERY DAY 90 tablet 2   triamcinolone cream (KENALOG) 0.1 % Apply 1 application topically 2 (two) times daily. X 7-10 days (Patient not taking: Reported on 01/02/2022) 135 each 0   triamterene-hydrochlorothiazide (MAXZIDE-25) 37.5-25 MG tablet Take 1 tablet by mouth daily. 90 tablet 3   No facility-administered medications prior to visit.    No Known Allergies  ROS Review of Systems    Objective:    Physical Exam  BP (!) 153/76 (BP Location: Right Arm, Patient Position: Sitting, Cuff Size: Normal)   Pulse 75   Ht '5\' 2"'$  (1.575 m)   Wt 197 lb 6.4 oz (89.5 kg)   SpO2 94%   BMI 36.10 kg/m  Wt Readings from Last 3 Encounters:  04/16/22 197 lb 6.4 oz (89.5 kg)  03/05/22 208 lb 3.2 oz (94.4 kg)  10/31/21 201 lb 6.4 oz (91.4 kg)    Lab Results  Component Value Date   TSH 2.650 03/05/2022   Lab Results  Component Value Date   WBC 6.7 03/05/2022   HGB 13.4 03/05/2022   HCT 40.1 03/05/2022   MCV 83 03/05/2022   PLT 290 03/05/2022   Lab Results  Component Value Date   NA 138 03/05/2022   K 4.2 03/05/2022   CO2 23 03/05/2022   GLUCOSE 108 (H) 03/05/2022   BUN 11 03/05/2022   CREATININE 0.95 03/05/2022   BILITOT 0.3 03/05/2022   ALKPHOS 88 03/05/2022   AST 18 03/05/2022   ALT 18 03/05/2022   PROT 7.0 03/05/2022   ALBUMIN 4.5 03/05/2022   CALCIUM 9.7 03/05/2022   EGFR 65 03/05/2022   Lab Results  Component Value Date   CHOL 212 (H) 03/05/2022   Lab Results  Component Value Date   HDL 43 03/05/2022   Lab Results  Component Value Date   LDLCALC 131 (H) 03/05/2022   Lab Results   Component Value Date   TRIG 215 (H) 03/05/2022   Lab Results  Component Value Date   CHOLHDL 4.9 (H) 03/05/2022   Lab Results  Component Value Date   HGBA1C 6.8 (H) 03/05/2022      Assessment & Plan:   Problem List Items Addressed This Visit   None   No orders of the defined types were placed in this  encounter.   Follow-up: No follow-ups on file.    Lindell Spar, MD

## 2022-04-16 NOTE — Patient Instructions (Signed)
Please start taking Amlodipine 10 mg instead of 5 mg.  Please start taking Crestor for cholesterol.  Please continue to follow low carb diet and perform moderate exercise/walking at least 150 mins/week.  Please get fasting blood tests done before the next visit.

## 2022-04-17 ENCOUNTER — Encounter: Payer: Self-pay | Admitting: Internal Medicine

## 2022-04-17 NOTE — Assessment & Plan Note (Signed)
Lab Results  Component Value Date   HGBA1C 6.8 (H) 03/05/2022   New onset Associated with HTN and HLD Advised to follow diabetic diet Started statin F/u CMP and lipid panel Diabetic foot exam: Today Diabetic eye exam: Advised to follow up with Ophthalmology for diabetic eye exam

## 2022-04-17 NOTE — Assessment & Plan Note (Signed)
Has interrupted sleep Uses phone at nighttime when she wakes up Sleep hygiene discussed

## 2022-04-17 NOTE — Assessment & Plan Note (Signed)
Started Crestor Advised to follow cholesterol diet for now Check lipid profile

## 2022-04-17 NOTE — Assessment & Plan Note (Addendum)
Uses Voltaren gel over knees and hands Has Celebrex

## 2022-04-18 DIAGNOSIS — E782 Mixed hyperlipidemia: Secondary | ICD-10-CM | POA: Diagnosis not present

## 2022-04-18 DIAGNOSIS — E1169 Type 2 diabetes mellitus with other specified complication: Secondary | ICD-10-CM | POA: Diagnosis not present

## 2022-04-19 LAB — CMP14+EGFR
ALT: 13 IU/L (ref 0–32)
AST: 15 IU/L (ref 0–40)
Albumin/Globulin Ratio: 1.6 (ref 1.2–2.2)
Albumin: 4.7 g/dL (ref 3.9–4.9)
Alkaline Phosphatase: 96 IU/L (ref 44–121)
BUN/Creatinine Ratio: 11 — ABNORMAL LOW (ref 12–28)
BUN: 12 mg/dL (ref 8–27)
Bilirubin Total: 0.4 mg/dL (ref 0.0–1.2)
CO2: 23 mmol/L (ref 20–29)
Calcium: 10.2 mg/dL (ref 8.7–10.3)
Chloride: 98 mmol/L (ref 96–106)
Creatinine, Ser: 1.06 mg/dL — ABNORMAL HIGH (ref 0.57–1.00)
Globulin, Total: 3 g/dL (ref 1.5–4.5)
Glucose: 109 mg/dL — ABNORMAL HIGH (ref 70–99)
Potassium: 4.1 mmol/L (ref 3.5–5.2)
Sodium: 140 mmol/L (ref 134–144)
Total Protein: 7.7 g/dL (ref 6.0–8.5)
eGFR: 57 mL/min/{1.73_m2} — ABNORMAL LOW (ref >59)

## 2022-04-19 LAB — HEMOGLOBIN A1C
Est. average glucose Bld gHb Est-mCnc: 148 mg/dL
Hgb A1c MFr Bld: 6.8 % — ABNORMAL HIGH (ref 4.8–5.6)

## 2022-04-19 LAB — LIPID PANEL
Chol/HDL Ratio: 4 ratio (ref 0.0–4.4)
Cholesterol, Total: 188 mg/dL (ref 100–199)
HDL: 47 mg/dL (ref >39)
LDL Chol Calc (NIH): 124 mg/dL — ABNORMAL HIGH (ref 0–99)
Triglycerides: 94 mg/dL (ref 0–149)
VLDL Cholesterol Cal: 17 mg/dL (ref 5–40)

## 2022-04-21 LAB — MICROALBUMIN / CREATININE URINE RATIO
Creatinine, Urine: 12.8 mg/dL
Microalb/Creat Ratio: <23 mg/g creat (ref 0–29)
Microalbumin, Urine: <3 ug/mL

## 2022-05-22 ENCOUNTER — Other Ambulatory Visit: Payer: Self-pay | Admitting: Internal Medicine

## 2022-05-22 DIAGNOSIS — M25552 Pain in left hip: Secondary | ICD-10-CM

## 2022-05-22 DIAGNOSIS — M159 Polyosteoarthritis, unspecified: Secondary | ICD-10-CM

## 2022-05-22 MED ORDER — CELECOXIB 100 MG PO CAPS: 100.0000 mg | ORAL_CAPSULE | Freq: Every day | ORAL | 5 refills | Status: DC

## 2022-06-07 DIAGNOSIS — E119 Type 2 diabetes mellitus without complications: Secondary | ICD-10-CM | POA: Diagnosis not present

## 2022-06-07 DIAGNOSIS — H2513 Age-related nuclear cataract, bilateral: Secondary | ICD-10-CM | POA: Diagnosis not present

## 2022-06-07 LAB — HM DIABETES EYE EXAM

## 2022-06-27 ENCOUNTER — Ambulatory Visit: Payer: Medicare HMO | Admitting: Internal Medicine

## 2022-07-31 ENCOUNTER — Ambulatory Visit: Payer: Medicare HMO | Admitting: Internal Medicine

## 2022-08-02 ENCOUNTER — Telehealth: Payer: Self-pay | Admitting: Internal Medicine

## 2022-08-02 ENCOUNTER — Other Ambulatory Visit: Payer: Self-pay

## 2022-08-02 MED ORDER — LEVOTHYROXINE SODIUM 75 MCG PO TABS: 75.0000 ug | ORAL_TABLET | Freq: Every day | ORAL | 2 refills | Status: DC

## 2022-08-02 NOTE — Telephone Encounter (Signed)
error 

## 2022-08-08 ENCOUNTER — Ambulatory Visit (INDEPENDENT_AMBULATORY_CARE_PROVIDER_SITE_OTHER): Payer: Medicare HMO | Admitting: Internal Medicine

## 2022-08-08 ENCOUNTER — Encounter: Payer: Self-pay | Admitting: Internal Medicine

## 2022-08-08 VITALS — BP 134/70 | HR 84 | Ht 62.0 in | Wt 195.0 lb

## 2022-08-08 DIAGNOSIS — R7989 Other specified abnormal findings of blood chemistry: Secondary | ICD-10-CM

## 2022-08-08 DIAGNOSIS — M159 Polyosteoarthritis, unspecified: Secondary | ICD-10-CM

## 2022-08-08 DIAGNOSIS — E782 Mixed hyperlipidemia: Secondary | ICD-10-CM

## 2022-08-08 DIAGNOSIS — E039 Hypothyroidism, unspecified: Secondary | ICD-10-CM

## 2022-08-08 DIAGNOSIS — L729 Follicular cyst of the skin and subcutaneous tissue, unspecified: Secondary | ICD-10-CM | POA: Diagnosis not present

## 2022-08-08 DIAGNOSIS — E1169 Type 2 diabetes mellitus with other specified complication: Secondary | ICD-10-CM

## 2022-08-08 DIAGNOSIS — I10 Essential (primary) hypertension: Secondary | ICD-10-CM | POA: Diagnosis not present

## 2022-08-08 NOTE — Assessment & Plan Note (Addendum)
BP Readings from Last 1 Encounters:  08/08/22 134/70   Well-controlled with Maxzide 37.5-25 mg and Amlodipine 10 mg QD Counseled for compliance with the medications Advised DASH diet and moderate exercise/walking as tolerated

## 2022-08-08 NOTE — Patient Instructions (Signed)
Please continue to take medications as prescribed.  Please continue to follow low carb diet and perform moderate exercise/walking at least 150 mins/week.  Please get fasting blood tests done before the next visit. 

## 2022-08-08 NOTE — Assessment & Plan Note (Addendum)
Last CMP showed slightly elevated creatinine and decreasing GFR-57 She is on Maxide, needs to maintain adequate hydration She has cut down soft drink intake Needs to avoid daily NSAID intake, alternate with Tylenol arthritis Check CMP today

## 2022-08-08 NOTE — Assessment & Plan Note (Addendum)
Lab Results  Component Value Date   HGBA1C 6.8 (H) 04/18/2022   Overall well-controlled with diet modification Associated with HTN and HLD Advised to follow diabetic diet On statin F/u CMP and lipid panel Diabetic eye exam: Advised to follow up with Ophthalmology for diabetic eye exam

## 2022-08-08 NOTE — Assessment & Plan Note (Signed)
Right forearm mass likely subcutaneous cyst Reassured it being benign If it becomes painful or infected, will refer to dermatology for I&D

## 2022-08-08 NOTE — Assessment & Plan Note (Addendum)
Uses Voltaren gel over knees and hands Has Celebrex Advised to take Tylenol arthritis alternatively with Celebrex

## 2022-08-08 NOTE — Assessment & Plan Note (Signed)
On Crestor Advised to follow cholesterol diet for now Check lipid profile

## 2022-08-08 NOTE — Assessment & Plan Note (Signed)
Lab Results  Component Value Date   TSH 2.650 03/05/2022   On levothyroxine 75 mcg daily 

## 2022-08-08 NOTE — Progress Notes (Addendum)
Established Patient Office Visit  Subjective:  Patient ID: Brittany Diaz, female    DOB: [birth year]  Age: 72 y.o. MRN: [id number]  CC:  Chief Complaint  Patient presents with   Diabetes    Two month follow up    Cyst    Patient has a knot under her skin on her right forearm    Hypertension    Two month follow up     HPI Brittany Diaz is a 72 y.o. female with past medical history of HTN, hypothyroidism, osteopenia and generalized OA who presents for f/u of her chronic medical conditions.  HTN: BP is wnl today.  She has been taking Maxzide 31.5-25 mg QD and amlodipine 10 mg QD. She reports that she has been stressed as her grand daughter moved in with her, who has schizophrenia. Patient denies headache, dizziness, chest pain, dyspnea or palpitations.  Hypothyroidism: She has been taking levothyroxine regularly.  She reports intermittent fatigue, but denies any recent change in appetite or weight, tremors, vision problems, or recent hair changes.  Type II DM: Her last HbA1c was 6.8 in 03/24.  She admits that she had not been watching over her diet, but has cut down on soda and has been watching over carb content in her food. She wants to improve her diet before starting any medicine for DM.  HLD: She has started taking Crestor regularly.  She reports her bump over dorsal aspect of her right forearm for the last 4 months.  It has been growing in size, but denies any pain.  Denies any recent injury.  Past Medical History:  Diagnosis Date   Hypertension    Thyroid disease     Past Surgical History:  Procedure Laterality Date   ABDOMINAL HYSTERECTOMY     CESAREAN SECTION     ELBOW SURGERY      Family History  Problem Relation Age of Onset   Thyroid disease Mother    Hypertension Mother    Hypertension Father    Diabetes Father    Breast cancer Daughter     Social History   Socioeconomic History   Marital status: Divorced    Spouse name: Not on file   Number of  children: Not on file   Years of education: Not on file   Highest education level: Not on file  Occupational History   Not on file  Tobacco Use   Smoking status: Never   Smokeless tobacco: Never  Substance and Sexual Activity   Alcohol use: No   Drug use: No   Sexual activity: Never    Partners: Male    Birth control/protection: None  Other Topics Concern   Not on file  Social History Narrative   Not on file   Social Determinants of Health   Financial Resource Strain: Low Risk  (01/02/2022)   Overall Financial Resource Strain (CARDIA)    Difficulty of Paying Living Expenses: Not hard at all  Food Insecurity: No Food Insecurity (01/02/2022)   Hunger Vital Sign    Worried About Running Out of Food in the Last Year: Never true    Ran Out of Food in the Last Year: Never true  Transportation Needs: Unknown (01/02/2022)   PRAPARE - Administrator, Civil Service (Medical): Not on file    Lack of Transportation (Non-Medical): No  Physical Activity: Insufficiently Active (12/13/2020)   Exercise Vital Sign    Days of Exercise per Week: 3 days    Minutes  of Exercise per Session: 30 min  Stress: No Stress Concern Present (01/02/2022)   [employer] of Occupational Health - Occupational Stress Questionnaire    Feeling of Stress : Not at all  Social Connections: Moderately Integrated (01/02/2022)   Social Connection and Isolation Panel [NHANES]    Frequency of Communication with Friends and Family: More than three times a week    Frequency of Social Gatherings with Friends and Family: More than three times a week    Attends Religious Services: More than 4 times per year    Active Member of [employer] or Organizations: Yes    Attends Engineer, structural: More than 4 times per year    Marital Status: Divorced  Intimate Partner Violence: Not At Risk (12/13/2020)   Humiliation, Afraid, Rape, and Kick questionnaire    Fear of Current or Ex-Partner: No    Emotionally  Abused: No    Physically Abused: No    Sexually Abused: No    Outpatient Medications Prior to Visit  Medication Sig Dispense Refill   albuterol (PROVENTIL HFA;VENTOLIN HFA) 108 (90 Base) MCG/ACT inhaler Inhale 2 puffs into the lungs every 4 (four) hours as needed. 1 Inhaler 0   amLODipine (NORVASC) 10 MG tablet Take 1 tablet (10 mg total) by mouth daily. 90 tablet 3   celecoxib (CELEBREX) 100 MG capsule Take 1 capsule (100 mg total) by mouth daily. 30 capsule 5   cyclobenzaprine (FLEXERIL) 10 MG tablet Take 1 tablet (10 mg total) by mouth 2 (two) times daily as needed for muscle spasms. 20 tablet 0   diclofenac Sodium (VOLTAREN) 1 % GEL Apply 2 g topically 4 (four) times daily as needed. For right hand pain prn. 50 g 1   EPINEPHrine 0.3 mg/0.3 mL IJ SOAJ injection Inject 0.3 mg into the muscle as needed for anaphylaxis. 1 each 1   levothyroxine (SYNTHROID) 75 MCG tablet Take 1 tablet (75 mcg total) by mouth daily. 90 tablet 2   rosuvastatin (CRESTOR) 5 MG tablet Take 1 tablet (5 mg total) by mouth daily. 90 tablet 3   triamcinolone cream (KENALOG) 0.1 % Apply 1 application topically 2 (two) times daily. X 7-10 days (Patient not taking: Reported on 01/02/2022) 135 each 0   triamterene-hydrochlorothiazide (MAXZIDE-25) 37.5-25 MG tablet Take 1 tablet by mouth daily. 90 tablet 3   No facility-administered medications prior to visit.    No Known Allergies  ROS Review of Systems  Constitutional:  Negative for chills and fever.  HENT:  Negative for congestion, sinus pressure, sinus pain and sore throat.   Eyes:  Negative for pain and discharge.  Respiratory:  Negative for cough and shortness of breath.   Cardiovascular:  Negative for chest pain and palpitations.  Gastrointestinal:  Negative for abdominal pain, diarrhea, nausea and vomiting.  Endocrine: Negative for polydipsia and polyuria.  Genitourinary:  Negative for dysuria and hematuria.  Musculoskeletal:  Positive for arthralgias (L  hip). Negative for neck pain and neck stiffness.  Skin:  Negative for rash.  Neurological:  Negative for dizziness and weakness.  Psychiatric/Behavioral:  Negative for agitation and behavioral problems.       Objective:    Physical Exam Vitals reviewed.  Constitutional:      General: She is not in acute distress.    Appearance: She is not diaphoretic.  HENT:     Head: Normocephalic and atraumatic.     Nose: Nose normal.     Mouth/Throat:     Mouth: Mucous membranes are  moist.  Eyes:     General: No scleral icterus.    Extraocular Movements: Extraocular movements intact.  Cardiovascular:     Rate and Rhythm: Normal rate and regular rhythm.     Pulses: Normal pulses.     Heart sounds: Normal heart sounds. No murmur heard. Pulmonary:     Breath sounds: Normal breath sounds. No wheezing or rales.  Musculoskeletal:     Cervical back: Neck supple. No tenderness.     Left hip: Tenderness present. Decreased range of motion.     Right lower leg: No edema.     Left lower leg: No edema.  Skin:    General: Skin is warm.     Findings: No rash.     Comments: Subcutaneous cyst over dorsal aspect of right forearm-about 2 cm in diameter, nontender, no surrounding erythema  Neurological:     General: No focal deficit present.     Mental Status: She is alert and oriented to person, place, and time.     Cranial Nerves: No cranial nerve deficit.     Sensory: No sensory deficit.     Motor: No weakness.  Psychiatric:        Mood and Affect: Mood normal.        Behavior: Behavior normal.     BP 134/70 (BP Location: Right Arm)   Pulse 84   Ht 5\' 2"  (1.575 m)   Wt 195 lb (88.5 kg)   SpO2 94%   BMI 35.67 kg/m  Wt Readings from Last 3 Encounters:  08/08/22 195 lb (88.5 kg)  04/16/22 197 lb 6.4 oz (89.5 kg)  03/05/22 208 lb 3.2 oz (94.4 kg)    Lab Results  Component Value Date   TSH 2.650 03/05/2022   Lab Results  Component Value Date   WBC 6.7 03/05/2022   HGB 13.4 03/05/2022    HCT 40.1 03/05/2022   MCV 83 03/05/2022   PLT 290 03/05/2022   Lab Results  Component Value Date   NA 140 04/18/2022   K 4.1 04/18/2022   CO2 23 04/18/2022   GLUCOSE 109 (H) 04/18/2022   BUN 12 04/18/2022   CREATININE 1.06 (H) 04/18/2022   BILITOT 0.4 04/18/2022   ALKPHOS 96 04/18/2022   AST 15 04/18/2022   ALT 13 04/18/2022   PROT 7.7 04/18/2022   ALBUMIN 4.7 04/18/2022   CALCIUM 10.2 04/18/2022   EGFR 57 (L) 04/18/2022   Lab Results  Component Value Date   CHOL 188 04/18/2022   Lab Results  Component Value Date   HDL 47 04/18/2022   Lab Results  Component Value Date   LDLCALC 124 (H) 04/18/2022   Lab Results  Component Value Date   TRIG 94 04/18/2022   Lab Results  Component Value Date   CHOLHDL 4.0 04/18/2022   Lab Results  Component Value Date   HGBA1C 6.8 (H) 04/18/2022      Assessment & Plan:   Problem List Items Addressed This Visit       Cardiovascular and Mediastinum   Essential hypertension    BP Readings from Last 1 Encounters:  08/08/22 134/70  Well-controlled with Maxzide 37.5-25 mg and Amlodipine 10 mg QD Counseled for compliance with the medications Advised DASH diet and moderate exercise/walking as tolerated        Endocrine   Hypothyroidism    Lab Results  Component Value Date   TSH 2.650 03/05/2022  On levothyroxine 75 mcg daily      Relevant Orders  TSH + free T4   Diabetes mellitus (HCC) - Primary    Lab Results  Component Value Date   HGBA1C 6.8 (H) 04/18/2022   Overall well-controlled with diet modification Associated with HTN and HLD Advised to follow diabetic diet On statin F/u CMP and lipid panel Diabetic eye exam: Advised to follow up with Ophthalmology for diabetic eye exam      Relevant Orders   CMP14+EGFR   Hemoglobin A1c     Musculoskeletal and Integument   Generalized OA    Uses Voltaren gel over knees and hands Has Celebrex Advised to take Tylenol arthritis alternatively with Celebrex         Other   Mixed hyperlipidemia    On Crestor Advised to follow cholesterol diet for now Check lipid profile      Relevant Orders   Lipid Profile   Subcutaneous cyst    Right forearm mass likely subcutaneous cyst Reassured it being benign If it becomes painful or infected, will refer to dermatology for I&D      Elevated serum creatinine    Last CMP showed slightly elevated creatinine and decreasing GFR-57 She is on Maxide, needs to maintain adequate hydration She has cut down soft drink intake Needs to avoid daily NSAID intake, alternate with Tylenol arthritis Check CMP today      No orders of the defined types were placed in this encounter.   Follow-up: Return in about 4 months (around 12/08/2022) for DM and HTN.    Anabel Halon, MD

## 2022-08-09 LAB — CMP14+EGFR
ALT: 17 IU/L (ref 0–32)
AST: 18 IU/L (ref 0–40)
Albumin: 4.4 g/dL (ref 3.9–4.9)
Alkaline Phosphatase: 92 IU/L (ref 44–121)
BUN/Creatinine Ratio: 11 — ABNORMAL LOW (ref 12–28)
BUN: 11 mg/dL (ref 8–27)
Bilirubin Total: 0.3 mg/dL (ref 0.0–1.2)
CO2: 23 mmol/L (ref 20–29)
Calcium: 9.4 mg/dL (ref 8.7–10.3)
Chloride: 100 mmol/L (ref 96–106)
Creatinine, Ser: 0.98 mg/dL (ref 0.57–1.00)
Globulin, Total: 3 g/dL (ref 1.5–4.5)
Glucose: 107 mg/dL — ABNORMAL HIGH (ref 70–99)
Potassium: 4.1 mmol/L (ref 3.5–5.2)
Sodium: 139 mmol/L (ref 134–144)
Total Protein: 7.4 g/dL (ref 6.0–8.5)
eGFR: 62 mL/min/{1.73_m2} (ref >59)

## 2022-08-09 LAB — LIPID PANEL
Chol/HDL Ratio: 4.2 ratio (ref 0.0–4.4)
Cholesterol, Total: 192 mg/dL (ref 100–199)
HDL: 46 mg/dL (ref >39)
LDL Chol Calc (NIH): 123 mg/dL — ABNORMAL HIGH (ref 0–99)
Triglycerides: 126 mg/dL (ref 0–149)
VLDL Cholesterol Cal: 23 mg/dL (ref 5–40)

## 2022-08-09 LAB — HEMOGLOBIN A1C
Est. average glucose Bld gHb Est-mCnc: 143 mg/dL
Hgb A1c MFr Bld: 6.6 % — ABNORMAL HIGH (ref 4.8–5.6)

## 2022-08-09 LAB — TSH+FREE T4
Free T4: 1.03 ng/dL (ref 0.82–1.77)
TSH: 5.39 u[IU]/mL — ABNORMAL HIGH (ref 0.450–4.500)

## 2022-10-14 ENCOUNTER — Other Ambulatory Visit: Payer: Self-pay | Admitting: Internal Medicine

## 2022-10-14 DIAGNOSIS — M25552 Pain in left hip: Secondary | ICD-10-CM

## 2022-10-14 DIAGNOSIS — M159 Polyosteoarthritis, unspecified: Secondary | ICD-10-CM

## 2022-12-05 ENCOUNTER — Ambulatory Visit (INDEPENDENT_AMBULATORY_CARE_PROVIDER_SITE_OTHER): Payer: Medicare HMO | Admitting: Internal Medicine

## 2022-12-05 ENCOUNTER — Encounter: Payer: Self-pay | Admitting: Internal Medicine

## 2022-12-05 VITALS — BP 133/83 | HR 78 | Ht 62.0 in | Wt 191.4 lb

## 2022-12-05 DIAGNOSIS — Z23 Encounter for immunization: Secondary | ICD-10-CM | POA: Diagnosis not present

## 2022-12-05 DIAGNOSIS — I10 Essential (primary) hypertension: Secondary | ICD-10-CM

## 2022-12-05 DIAGNOSIS — F5101 Primary insomnia: Secondary | ICD-10-CM

## 2022-12-05 DIAGNOSIS — M1610 Unilateral primary osteoarthritis, unspecified hip: Secondary | ICD-10-CM | POA: Diagnosis not present

## 2022-12-05 DIAGNOSIS — E039 Hypothyroidism, unspecified: Secondary | ICD-10-CM | POA: Diagnosis not present

## 2022-12-05 DIAGNOSIS — E782 Mixed hyperlipidemia: Secondary | ICD-10-CM

## 2022-12-05 DIAGNOSIS — E1169 Type 2 diabetes mellitus with other specified complication: Secondary | ICD-10-CM | POA: Diagnosis not present

## 2022-12-05 MED ORDER — CELECOXIB 200 MG PO CAPS: 200.0000 mg | ORAL_CAPSULE | Freq: Every day | ORAL | 1 refills | Status: DC

## 2022-12-05 NOTE — Assessment & Plan Note (Signed)
Lab Results  Component Value Date   HGBA1C 6.6 (H) 08/08/2022   Overall well-controlled with diet modification Associated with HTN and HLD Advised to follow diabetic diet On statin F/u CMP and lipid panel Diabetic eye exam: Advised to follow up with Ophthalmology for diabetic eye exam

## 2022-12-05 NOTE — Progress Notes (Addendum)
 Established Patient Office Visit  Subjective:  Patient ID: Brittany Diaz, female    DOB: 1950/07/13  Age: 72 y.o. MRN: [id number]  CC:  Chief Complaint  Patient presents with   Diabetes    Follow up    Hypertension    Follow up    Hip Pain    Hip, back and leg pain     HPI Brittany Diaz is a 72 y.o. female with past medical history of HTN, hypothyroidism, osteopenia and generalized OA who presents for f/u of her chronic medical conditions.  HTN: BP is wnl today.  She has been taking Maxzide 37.5-25 mg QD and amlodipine  10 mg QD. She reports that she has been stressed as her grand daughter moved in with her again, who has schizophrenia. Patient denies headache, dizziness, chest pain, dyspnea or palpitations.  Hypothyroidism: She has been taking levothyroxine  75 mcg QD regularly now.  She reports intermittent fatigue, but denies any recent change in appetite, tremors, vision problems, or recent hair changes.  Type II DM: Her last HbA1c was 6.6 in 06/24.  She admits that she had not been watching over her diet, but has cut down on soda and has been watching over carb content in her food. She has lost 4 lbs with diet modification. She wants to improve her diet before starting any medicine for DM.  HLD: She has been taking Crestor  regularly.  Left hip pain: She has chronic left hip pain, for for which she has tried taking Celebrex  100 mg with mild relief.  She recently stopped taking it as it was not effective.  She applies local pain relieving cream with mild relief as well.  She has had x-ray of pelvis, which showed bilateral mild hip OA in 09/23.  She used to have right hip pain, which has improved with steroid injection in the past.  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    Past Medical History:  Diagnosis Date   Hypertension    Thyroid  disease     Past Surgical History:  Procedure Laterality Date   ABDOMINAL  HYSTERECTOMY     CESAREAN SECTION     ELBOW SURGERY      Family History  Problem Relation Age of Onset   Thyroid  disease Mother    Hypertension Mother    Hypertension Father    Diabetes Father    Breast cancer Daughter     Social History   Socioeconomic History   Marital status: Divorced    Spouse name: Not on file   Number of children: Not on file   Years of education: Not on file   Highest education level: Not on file  Occupational History   Not on file  Tobacco Use   Smoking status: Never   Smokeless tobacco: Never  Substance and Sexual Activity   Alcohol use: No   Drug use: No   Sexual activity: Never    Partners: Male    Birth control/protection: None  Other Topics Concern   Not on file  Social History Narrative   Not on file   Social Determinants of Health   Financial Resource Strain: Low Risk  (01/02/2022)   Overall Financial Resource Strain (CARDIA)    Difficulty of Paying Living Expenses: Not hard at all  Food Insecurity: No Food Insecurity (01/02/2022)   Hunger Vital Sign    Worried About Running Out of Food in the Last Year: Never true  Ran Out of Food in the Last Year: Never true  Transportation Needs: Unknown (01/02/2022)   PRAPARE - Administrator, Civil Service (Medical): Not on file    Lack of Transportation (Non-Medical): No  Physical Activity: Insufficiently Active (12/13/2020)   Exercise Vital Sign    Days of Exercise per Week: 3 days    Minutes of Exercise per Session: 30 min  Stress: No Stress Concern Present (01/02/2022)   [employer] of Occupational Health - Occupational Stress Questionnaire    Feeling of Stress : Not at all  Social Connections: Moderately Integrated (01/02/2022)   Social Connection and Isolation Panel [NHANES]    Frequency of Communication with Friends and Family: More than three times a week    Frequency of Social Gatherings with Friends and Family: More than three times a week    Attends  Religious Services: More than 4 times per year    Active Member of [employer] or Organizations: Yes    Attends Engineer, structural: More than 4 times per year    Marital Status: Divorced  Intimate Partner Violence: Not At Risk (12/13/2020)   Humiliation, Afraid, Rape, and Kick questionnaire    Fear of Current or Ex-Partner: No    Emotionally Abused: No    Physically Abused: No    Sexually Abused: No    Outpatient Medications Prior to Visit  Medication Sig Dispense Refill   albuterol  (PROVENTIL  HFA;VENTOLIN  HFA) 108 (90 Base) MCG/ACT inhaler Inhale 2 puffs into the lungs every 4 (four) hours as needed. 1 Inhaler 0   amLODipine  (NORVASC ) 10 MG tablet Take 1 tablet (10 mg total) by mouth daily. 90 tablet 3   cyclobenzaprine  (FLEXERIL ) 10 MG tablet Take 1 tablet (10 mg total) by mouth 2 (two) times daily as needed for muscle spasms. 20 tablet 0   diclofenac  Sodium (VOLTAREN ) 1 % GEL Apply 2 g topically 4 (four) times daily as needed. For right hand pain prn. 50 g 1   EPINEPHrine  0.3 mg/0.3 mL IJ SOAJ injection Inject 0.3 mg into the muscle as needed for anaphylaxis. 1 each 1   levothyroxine  (SYNTHROID ) 75 MCG tablet Take 1 tablet (75 mcg total) by mouth daily. 90 tablet 2   rosuvastatin  (CRESTOR ) 5 MG tablet Take 1 tablet (5 mg total) by mouth daily. 90 tablet 3   triamcinolone  cream (KENALOG ) 0.1 % Apply 1 application topically 2 (two) times daily. X 7-10 days (Patient not taking: Reported on 01/02/2022) 135 each 0   triamterene -hydrochlorothiazide (MAXZIDE-25) 37.5-25 MG tablet Take 1 tablet by mouth daily. 90 tablet 3   celecoxib  (CELEBREX ) 100 MG capsule TAKE 1 CAPSULE EVERY DAY 90 capsule 3   No facility-administered medications prior to visit.    No Known Allergies  ROS Review of Systems  Constitutional:  Negative for chills and fever.  HENT:  Negative for congestion, sinus pressure, sinus pain and sore throat.   Eyes:  Negative for pain and discharge.  Respiratory:  Negative  for cough and shortness of breath.   Cardiovascular:  Negative for chest pain and palpitations.  Gastrointestinal:  Negative for abdominal pain, diarrhea, nausea and vomiting.  Endocrine: Negative for polydipsia and polyuria.  Genitourinary:  Negative for dysuria and hematuria.  Musculoskeletal:  Positive for arthralgias (L hip) and back pain. Negative for neck pain and neck stiffness.  Skin:  Negative for rash.  Neurological:  Negative for dizziness and weakness.  Psychiatric/Behavioral:  Negative for agitation and behavioral problems.  Objective:    Physical Exam Vitals reviewed.  Constitutional:      General: She is not in acute distress.    Appearance: She is not diaphoretic.  HENT:     Head: Normocephalic and atraumatic.     Nose: Nose normal.     Mouth/Throat:     Mouth: Mucous membranes are moist.  Eyes:     General: No scleral icterus.    Extraocular Movements: Extraocular movements intact.  Cardiovascular:     Rate and Rhythm: Normal rate and regular rhythm.     Pulses: Normal pulses.     Heart sounds: Normal heart sounds. No murmur heard. Pulmonary:     Breath sounds: Normal breath sounds. No wheezing or rales.  Musculoskeletal:     Cervical back: Neck supple. No tenderness.     Lumbar back: Tenderness present. Negative right straight leg raise test and negative left straight leg raise test.     Left hip: Tenderness present.     Right lower leg: No edema.     Left lower leg: No edema.  Skin:    General: Skin is warm.     Findings: No rash.     Comments: Subcutaneous cyst over dorsal aspect of right forearm-about 2 cm in diameter, nontender, no surrounding erythema  Neurological:     General: No focal deficit present.     Mental Status: She is alert and oriented to person, place, and time.     Sensory: No sensory deficit.     Motor: No weakness.  Psychiatric:        Mood and Affect: Mood normal.        Behavior: Behavior normal.     BP 133/83 (BP  Location: Right Arm, Patient Position: Sitting, Cuff Size: Large)   Pulse 78   Ht 5' 2 (1.575 m)   Wt 191 lb 6.4 oz (86.8 kg)   SpO2 95%   BMI 35.01 kg/m  Wt Readings from Last 3 Encounters:  12/05/22 191 lb 6.4 oz (86.8 kg)  08/08/22 195 lb (88.5 kg)  04/16/22 197 lb 6.4 oz (89.5 kg)    Lab Results  Component Value Date   TSH 5.390 (H) 08/08/2022   Lab Results  Component Value Date   WBC 6.7 03/05/2022   HGB 13.4 03/05/2022   HCT 40.1 03/05/2022   MCV 83 03/05/2022   PLT 290 03/05/2022   Lab Results  Component Value Date   NA 139 08/08/2022   K 4.1 08/08/2022   CO2 23 08/08/2022   GLUCOSE 107 (H) 08/08/2022   BUN 11 08/08/2022   CREATININE 0.98 08/08/2022   BILITOT 0.3 08/08/2022   ALKPHOS 92 08/08/2022   AST 18 08/08/2022   ALT 17 08/08/2022   PROT 7.4 08/08/2022   ALBUMIN 4.4 08/08/2022   CALCIUM  9.4 08/08/2022   EGFR 62 08/08/2022   Lab Results  Component Value Date   CHOL 192 08/08/2022   Lab Results  Component Value Date   HDL 46 08/08/2022   Lab Results  Component Value Date   LDLCALC 123 (H) 08/08/2022   Lab Results  Component Value Date   TRIG 126 08/08/2022   Lab Results  Component Value Date   CHOLHDL 4.2 08/08/2022   Lab Results  Component Value Date   HGBA1C 6.6 (H) 08/08/2022      Assessment & Plan:   Problem List Items Addressed This Visit       Cardiovascular and Mediastinum   Essential hypertension -  Primary    BP Readings from Last 1 Encounters:  12/05/22 133/83   Well-controlled with Maxzide 37.5-25 mg and Amlodipine  10 mg QD Counseled for compliance with the medications Advised DASH diet and moderate exercise/walking as tolerated        Endocrine   Hypothyroidism    Lab Results  Component Value Date   TSH 5.390 (H) 08/08/2022   On levothyroxine  75 mcg daily Recheck TSH and free T4      Relevant Orders   TSH + free T4   Diabetes mellitus (HCC)    Lab Results  Component Value Date   HGBA1C 6.6 (H)  08/08/2022   Overall well-controlled with diet modification Associated with HTN and HLD Advised to follow diabetic diet On statin F/u CMP and lipid panel Diabetic eye exam: Advised to follow up with Ophthalmology for diabetic eye exam      Relevant Orders   CMP14+EGFR   Hemoglobin A1c     Musculoskeletal and Integument   Hip arthritis    X-ray of hip showed mild arthritis Needs to take Celebrex  as needed, increased dose to 200 mg QD Advised to apply ice over the left hip area Referred to orthopedic surgeon      Relevant Medications   celecoxib  (CELEBREX ) 200 MG capsule   Other Relevant Orders   Ambulatory referral to Orthopedic Surgery     Other   Mixed hyperlipidemia    On Crestor  Advised to follow cholesterol diet for now Check lipid profile      Primary insomnia    Has interrupted sleep Uses phone at nighttime when she wakes up Sleep hygiene discussed      Other Visit Diagnoses     Encounter for immunization       Relevant Orders   Flu Vaccine Trivalent High Dose (Fluad) (Completed)       Meds ordered this encounter  Medications   celecoxib  (CELEBREX ) 200 MG capsule    Sig: Take 1 capsule (200 mg total) by mouth daily.    Dispense:  90 capsule    Refill:  1    Follow-up: Return in about 4 months (around 04/07/2023) for Annual physical.    Suzzane MARLA Blanch, MD

## 2022-12-05 NOTE — Assessment & Plan Note (Addendum)
Lab Results  Component Value Date   TSH 5.390 (H) 08/08/2022   On levothyroxine 75 mcg daily Recheck TSH and free T4

## 2022-12-05 NOTE — Assessment & Plan Note (Signed)
BP Readings from Last 1 Encounters:  12/05/22 133/83   Well-controlled with Maxzide 37.5-25 mg and Amlodipine 10 mg QD Counseled for compliance with the medications Advised DASH diet and moderate exercise/walking as tolerated

## 2022-12-05 NOTE — Assessment & Plan Note (Signed)
On Crestor Advised to follow cholesterol diet for now Check lipid profile

## 2022-12-05 NOTE — Patient Instructions (Signed)
Please take Celecoxib 200 mg once daily for hip pain.  Please perform simple back exercises as instructed.  Please continue to take medications as prescribed.  Please continue to follow low carb diet and perform moderate exercise/walking at least 150 mins/week.

## 2022-12-05 NOTE — Assessment & Plan Note (Deleted)
Uses Voltaren gel over knees and hands Has Celebrex Advised to take Tylenol arthritis alternatively with Celebrex

## 2022-12-05 NOTE — Assessment & Plan Note (Addendum)
X-ray of hip showed mild arthritis Needs to take Celebrex as needed, increased dose to 200 mg QD Advised to apply ice over the left hip area Referred to orthopedic surgeon

## 2022-12-05 NOTE — Assessment & Plan Note (Signed)
Has interrupted sleep Uses phone at nighttime when she wakes up Sleep hygiene discussed

## 2022-12-06 LAB — CMP14+EGFR
ALT: 18 [IU]/L (ref 0–32)
AST: 22 [IU]/L (ref 0–40)
Albumin: 4.6 g/dL (ref 3.9–4.9)
Alkaline Phosphatase: 103 [IU]/L (ref 44–121)
BUN/Creatinine Ratio: 14 (ref 12–28)
BUN: 15 mg/dL (ref 8–27)
Bilirubin Total: 0.3 mg/dL (ref 0.0–1.2)
CO2: 22 mmol/L (ref 20–29)
Calcium: 10.1 mg/dL (ref 8.7–10.3)
Chloride: 98 mmol/L (ref 96–106)
Creatinine, Ser: 1.08 mg/dL — ABNORMAL HIGH (ref 0.57–1.00)
Globulin, Total: 3.3 g/dL (ref 1.5–4.5)
Glucose: 102 mg/dL — ABNORMAL HIGH (ref 70–99)
Potassium: 3.9 mmol/L (ref 3.5–5.2)
Sodium: 138 mmol/L (ref 134–144)
Total Protein: 7.9 g/dL (ref 6.0–8.5)
eGFR: 55 mL/min/{1.73_m2} — ABNORMAL LOW (ref >59)

## 2022-12-06 LAB — TSH+FREE T4
Free T4: 1.59 ng/dL (ref 0.82–1.77)
TSH: 2.77 u[IU]/mL (ref 0.450–4.500)

## 2022-12-06 LAB — HEMOGLOBIN A1C
Est. average glucose Bld gHb Est-mCnc: 148 mg/dL
Hgb A1c MFr Bld: 6.8 % — ABNORMAL HIGH (ref 4.8–5.6)

## 2022-12-16 ENCOUNTER — Other Ambulatory Visit: Payer: Self-pay | Admitting: Internal Medicine

## 2022-12-16 DIAGNOSIS — Z1231 Encounter for screening mammogram for malignant neoplasm of breast: Secondary | ICD-10-CM

## 2022-12-20 ENCOUNTER — Ambulatory Visit
Admission: RE | Admit: 2022-12-20 | Discharge: 2022-12-20 | Disposition: A | Discharge status: Home / Self Care | Payer: Medicare HMO | Source: Ambulatory Visit | Attending: Internal Medicine | Admitting: Internal Medicine

## 2022-12-20 DIAGNOSIS — Z1231 Encounter for screening mammogram for malignant neoplasm of breast: Secondary | ICD-10-CM

## 2022-12-24 ENCOUNTER — Ambulatory Visit: Payer: Medicare HMO | Admitting: Orthopedic Surgery

## 2022-12-25 ENCOUNTER — Other Ambulatory Visit (INDEPENDENT_AMBULATORY_CARE_PROVIDER_SITE_OTHER): Payer: Medicare HMO

## 2022-12-25 ENCOUNTER — Ambulatory Visit: Payer: Medicare HMO | Admitting: Orthopedic Surgery

## 2022-12-25 ENCOUNTER — Encounter: Payer: Self-pay | Admitting: Orthopedic Surgery

## 2022-12-25 VITALS — BP 134/80 | HR 64 | Ht 62.0 in | Wt 195.0 lb

## 2022-12-25 DIAGNOSIS — M25552 Pain in left hip: Secondary | ICD-10-CM

## 2022-12-25 DIAGNOSIS — M160 Bilateral primary osteoarthritis of hip: Secondary | ICD-10-CM | POA: Diagnosis not present

## 2022-12-25 DIAGNOSIS — M7062 Trochanteric bursitis, left hip: Secondary | ICD-10-CM

## 2022-12-25 NOTE — Patient Instructions (Signed)

## 2022-12-25 NOTE — Progress Notes (Signed)
Orthopaedic Clinic Return  Assessment: Brittany Diaz is a 72 y.o. female with the following: Left greater trochanteric bursitis  Plan: [name] has pain and tenderness over the lateral aspect of the left hip, overlying the greater trochanter.  Presentation is consistent with greater trochanteric bursitis.  We discussed multiple treatment options, pain medications, topical treatments, patches, cryotherapy, physical therapy as well as an injection.  I recommended a steroid injection.  She elected to proceed.  This was completed in clinic today.  If she has any further issues, she will contact the clinic.   Procedure note injection - Left lateral hip   Verbal consent was obtained to inject the Left lateral hip.  Patient localized the pain. Timeout was completed to confirm the site of injection.  The skin was prepped with alcohol and ethyl chloride was sprayed at the injection site.  A 21-gauge needle was used to inject 40 mg of Depo-Medrol and 1% lidocaine (4 cc) into the Left lateral hip, directly over the localized tenderness using a direct lateral approach.  There were no complications. A sterile bandage was applied.   Follow-up: Return if symptoms worsen or fail to improve.   Subjective:  No chief complaint on file.   History of Present Illness: Brittany Diaz is a 72 y.o. female who returns to clinic for evaluation of left hip pain.  She states she has had pain over the lateral aspect of the left hip for about a year.  She has tried to deal with it.  She has been taking medications, including Tylenol.  When she has taken medication, she is able to lay on her left side.  Otherwise, it is too painful, and she will have to rotate onto her right side.  She notes that the pain will radiate from the lateral hip, distal to the knee.  She does have occasional numbness and tingling.  She does not have pain in the back.  No pain in her groin.  Review of Systems: No fevers or  chills No numbness or tingling No chest pain No shortness of breath No bowel or bladder dysfunction No GI distress No headaches   Objective: BP 134/80   Pulse 64   Ht 5\' 2"  (1.575 m)   Wt 195 lb (88.5 kg)   BMI 35.67 kg/m   Physical Exam:  Alert and oriented.  No acute distress.  15 degrees of internal rotation, and 30 degrees of external rotation.  No pain in the groin.  She is able to maintain straight leg raise.  Negative straight leg raise bilaterally.  Sensation is intact bilateral lower extremities.  She has excellent strength in bilateral lower extremities.  She has tenderness to palpation directly over the greater trochanter laterally.  IMAGING: I personally ordered and reviewed the following images:  AP pelvis was obtained in clinic today.  No acute injuries are noted.  Appearance of bilateral hips is roughly the same.  Some small osteophytes are appreciated.  Joint space is equal bilaterally.  No subchondral sclerosis is appreciated.  No AVN.  Impression: AP pelvis with mild to moderate bilateral degenerative changes.   Standing lumbar spine x-rays were obtained in clinic today.  No acute injuries are noted.  Well-maintained disc height.  Small osteophytes are appreciated.  Trace anterolisthesis at L4-5.  There is some loss of disc height at L5-S1, but this is not severe.  No bony lesions.  Impression: Lumbar spine x-rays with mild degenerative changes, and trace anterolisthesis at L4-5.  Oliver Barre, MD 12/25/2022 10:37 AM

## 2022-12-27 ENCOUNTER — Encounter: Payer: Medicare HMO | Admitting: Orthopedic Surgery

## 2023-02-03 ENCOUNTER — Ambulatory Visit: Payer: Medicare HMO

## 2023-02-03 VITALS — Ht 62.0 in | Wt 191.0 lb

## 2023-02-03 DIAGNOSIS — Z0000 Encounter for general adult medical examination without abnormal findings: Secondary | ICD-10-CM | POA: Diagnosis not present

## 2023-02-03 NOTE — Progress Notes (Signed)
Subjective:   Brittany Diaz is a 72 y.o. female who presents for Medicare Annual (Subsequent) preventive examination.  Visit Complete: Virtual I connected with  Brittany Diaz on 02/03/23 by a audio enabled telemedicine application and verified that I am speaking with the correct person using two identifiers.  Patient Location: Home  Provider Location: Home Office  I discussed the limitations of evaluation and management by telemedicine. The patient expressed understanding and agreed to proceed.  Vital Signs: Because this visit was a virtual/telehealth visit, some criteria may be missing or patient reported. Any vitals not documented were not able to be obtained and vitals that have been documented are patient reported.  Patient Medicare AWV questionnaire was completed by the patient on (not done); I have confirmed that all information answered by patient is correct and no changes since this date.  Cardiac Risk Factors include: advanced age (>73men, >13 women);dyslipidemia;hypertension;obesity (BMI >30kg/m2);sedentary lifestyle     Objective:    Today's Vitals   02/03/23 0909 02/03/23 0910  Weight: 191 lb (86.6 kg)   Height: 5\' 2"  (1.575 m)   PainSc: 6  6    Body mass index is 34.93 kg/m.     02/03/2023    9:22 AM 01/02/2022    4:11 PM 12/13/2020    2:04 PM  Advanced Directives  Does Patient Have a Medical Advance Directive? Yes No No;Yes  Type of Estate agent of [place];Living will  Healthcare Power of Attorney  Does patient want to make changes to medical advance directive?  Yes (ED - Information included in AVS) Yes (ED - Information included in AVS)  Copy of Healthcare Power of Attorney in Chart? No - copy requested  No - copy requested  Would patient like information on creating a medical advance directive?   Yes (MAU/Ambulatory/Procedural Areas - Information given)    Current Medications (verified) Outpatient Encounter Medications as of  02/03/2023  Medication Sig   albuterol (PROVENTIL HFA;VENTOLIN HFA) 108 (90 Base) MCG/ACT inhaler Inhale 2 puffs into the lungs every 4 (four) hours as needed.   amLODipine (NORVASC) 10 MG tablet Take 1 tablet (10 mg total) by mouth daily.   celecoxib (CELEBREX) 200 MG capsule Take 1 capsule (200 mg total) by mouth daily.   diclofenac Sodium (VOLTAREN) 1 % GEL Apply 2 g topically 4 (four) times daily as needed. For right hand pain prn.   EPINEPHrine 0.3 mg/0.3 mL IJ SOAJ injection Inject 0.3 mg into the muscle as needed for anaphylaxis.   levothyroxine (SYNTHROID) 75 MCG tablet Take 1 tablet (75 mcg total) by mouth daily.   rosuvastatin (CRESTOR) 5 MG tablet Take 1 tablet (5 mg total) by mouth daily.   triamterene-hydrochlorothiazide (MAXZIDE-25) 37.5-25 MG tablet Take 1 tablet by mouth daily.   cyclobenzaprine (FLEXERIL) 10 MG tablet Take 1 tablet (10 mg total) by mouth 2 (two) times daily as needed for muscle spasms. (Patient not taking: Reported on 02/03/2023)   triamcinolone cream (KENALOG) 0.1 % Apply 1 application topically 2 (two) times daily. X 7-10 days (Patient not taking: Reported on 02/03/2023)   No facility-administered encounter medications on file as of 02/03/2023.    Allergies (verified) Patient has no known allergies.   History: Past Medical History:  Diagnosis Date   Hypertension    Thyroid disease    Past Surgical History:  Procedure Laterality Date   ABDOMINAL HYSTERECTOMY     CESAREAN SECTION     ELBOW SURGERY     Family History  Problem Relation Age of Onset   Thyroid disease Mother    Hypertension Mother    Hypertension Father    Diabetes Father    Breast cancer Daughter    Social History   Socioeconomic History   Marital status: Divorced    Spouse name: Not on file   Number of children: Not on file   Years of education: Not on file   Highest education level: Not on file  Occupational History   Not on file  Tobacco Use   Smoking status: Never    Smokeless tobacco: Never  Substance and Sexual Activity   Alcohol use: No   Drug use: No   Sexual activity: Never    Partners: Male    Birth control/protection: None  Other Topics Concern   Not on file  Social History Narrative   Not on file   Social Drivers of Health   Financial Resource Strain: Low Risk  (02/03/2023)   Overall Financial Resource Strain (CARDIA)    Difficulty of Paying Living Expenses: Not hard at all  Food Insecurity: No Food Insecurity (02/03/2023)   Hunger Vital Sign    Worried About Running Out of Food in the Last Year: Never true    Ran Out of Food in the Last Year: Never true  Transportation Needs: No Transportation Needs (02/03/2023)   PRAPARE - Administrator, Civil Service (Medical): No    Lack of Transportation (Non-Medical): No  Physical Activity: Insufficiently Active (02/03/2023)   Exercise Vital Sign    Days of Exercise per Week: 3 days    Minutes of Exercise per Session: 20 min  Stress: No Stress Concern Present (02/03/2023)   [employer] of Occupational Health - Occupational Stress Questionnaire    Feeling of Stress : Not at all  Social Connections: Moderately Integrated (02/03/2023)   Social Connection and Isolation Panel [NHANES]    Frequency of Communication with Friends and Family: More than three times a week    Frequency of Social Gatherings with Friends and Family: More than three times a week    Attends Religious Services: More than 4 times per year    Active Member of [employer] or Organizations: Yes    Attends Engineer, structural: More than 4 times per year    Marital Status: Divorced    Tobacco Counseling Counseling given: Not Answered   Clinical Intake:  Pre-visit preparation completed: No  Pain : 0-10 Pain Score: 6  Pain Location: Leg Pain Orientation: Left Pain Descriptors / Indicators: Aching Pain Onset: More than a month ago Pain Frequency: Intermittent Pain Relieving Factors: green  alcohol, medication sometimes  Pain Relieving Factors: green alcohol, medication sometimes  BMI - recorded: 34.93 Nutritional Status: BMI > 30  Obese Nutritional Risks: None Diabetes: No  How often do you need to have someone help you when you read instructions, pamphlets, or other written materials from your doctor or pharmacy?: 1 - Never  Interpreter Needed?: No  Comments: two grandchildren live with pt Information entered by :: B.Kaled Allende,LPN   Activities of Daily Living    02/03/2023    9:22 AM  In your present state of health, do you have any difficulty performing the following activities:  Hearing? 0  Vision? 0  Difficulty concentrating or making decisions? 0  Walking or climbing stairs? 0  Dressing or bathing? 0  Doing errands, shopping? 0  Preparing Food and eating ? N  Using the Toilet? N  In the past six months,  have you accidently leaked urine? N  Do you have problems with loss of bowel control? N  Managing your Medications? N  Managing your Finances? N  Housekeeping or managing your Housekeeping? N    Patient Care Team: Anabel Halon, MD as PCP - General (Internal Medicine)  Indicate any recent Medical Services you may have received from other than [facility] providers in the past year (date may be approximate).     Assessment:   This is a routine wellness examination for Brittany Diaz.  Hearing/Vision screen Hearing Screening - Comments:: Pt says her hearing is good Vision Screening - Comments:: Pt says her vision is good with glasses for reading [facility]   Goals Addressed             This Visit's Progress    Patient Stated   Not on track    Finish swim classes at the [facility].     Patient Stated   Not on track    Patient states that her goal is to learn how to swim       Depression Screen    02/03/2023    9:19 AM 12/05/2022    8:37 AM 08/08/2022    9:37 AM 04/16/2022    9:23 AM 03/05/2022   10:01 AM 01/02/2022    4:13 PM 10/31/2021    9:44 AM   PHQ 2/9 Scores  PHQ - 2 Score 0 0 0 0 0 1 0  PHQ- 9 Score  3         Fall Risk    02/03/2023    9:33 AM 12/05/2022    8:36 AM 08/08/2022    9:37 AM 04/16/2022    9:23 AM 03/05/2022   10:01 AM  Fall Risk   Falls in the past year? 0 0 0 0 0  Number falls in past yr: 0 0 0 0 0  Injury with Fall? 0 0 0 0 0  Risk for fall due to : No Fall Risks No Fall Risks     Follow up Education provided;Falls prevention discussed Falls evaluation completed       MEDICARE RISK AT HOME: Medicare Risk at Home Any stairs in or around the home?: Yes If so, are there any without handrails?: Yes Home free of loose throw rugs in walkways, pet beds, electrical cords, etc?: Yes Adequate lighting in your home to reduce risk of falls?: Yes Life alert?: No Use of a cane, walker or w/c?: No Grab bars in the bathroom?: Yes Shower chair or bench in shower?: No Elevated toilet seat or a handicapped toilet?: No  TIMED UP AND GO:  Was the test performed?  No    Cognitive Function:    12/13/2020    2:05 PM  MMSE - Mini Mental State Exam  Not completed: Unable to complete        02/03/2023    9:23 AM 01/02/2022    4:16 PM 12/13/2020    2:06 PM  6CIT Screen  What Year? 0 points 0 points 0 points  What month? 0 points 0 points 0 points  What time? 0 points 0 points 0 points  Count back from 20 0 points 0 points 0 points  Months in reverse 0 points 0 points 0 points  Repeat phrase 0 points 0 points 0 points  Total Score 0 points 0 points 0 points    Immunizations Immunization History  Administered Date(s) Administered   Fluad Quad(high Dose 65+) 11/23/2019, 10/27/2020, 10/31/2021  Fluad Trivalent(High Dose 65+) 12/05/2022   Influenza,inj,Quad PF,6+ Mos 04/30/2017, 01/28/2018   Moderna SARS-COV2 Booster Vaccination 02/03/2020   Moderna Sars-Covid-2 Vaccination 04/22/2019, 05/25/2019   Pneumococcal Conjugate-13 09/24/2018   Pneumococcal Polysaccharide-23 04/30/2017   Zoster  Recombinant(Shingrix) 10/24/2021, 03/08/2022    TDAP status: Up to date  Flu Vaccine status: Up to date  Pneumococcal vaccine status: Up to date  Covid-19 vaccine status: Completed vaccines  Qualifies for Shingles Vaccine? Yes   Zostavax completed Yes   Shingrix Completed?: Yes  Screening Tests Health Maintenance  Topic Date Due   COVID-19 Vaccine (4 - 2024-25 season) 05/12/2023 (Originally 10/13/2022)   DTaP/Tdap/Td (1 - Tdap) 02/03/2024 (Originally 03/25/1969)   FOOT EXAM  04/16/2023   Diabetic kidney evaluation - Urine ACR  04/18/2023   HEMOGLOBIN A1C  06/05/2023   OPHTHALMOLOGY EXAM  06/07/2023   Diabetic kidney evaluation - eGFR measurement  12/05/2023   Medicare Annual Wellness (AWV)  02/03/2024   Fecal DNA (Cologuard)  03/08/2024   MAMMOGRAM  12/19/2024   Pneumonia Vaccine 34+ Years old  Completed   INFLUENZA VACCINE  Completed   DEXA SCAN  Completed   Hepatitis C Screening  Completed   Zoster Vaccines- Shingrix  Completed   HPV VACCINES  Aged Out   Colonoscopy  Discontinued    Health Maintenance  There are no preventive care reminders to display for this patient.   Colorectal cancer screening: No longer required. DISCONTINUED per chart cologard done 03/08/2021  Mammogram status: Completed 12/20/22/. Repeat every year  Bone Density status: Completed 11/09/2021. Results reflect: Bone density results: OSTEOPENIA. Repeat every 3 years.  Lung Cancer Screening: (Low Dose CT Chest recommended if Age 36-80 years, 20 pack-year currently smoking OR have quit w/in 15years.) does not qualify.   Lung Cancer Screening Referral: no  Additional Screening:  Hepatitis C Screening: does not qualify; Completed 03/01/2021  Vision Screening: Recommended annual ophthalmology exams for early detection of glaucoma and other disorders of the eye. Is the patient up to date with their annual eye exam?  Yes  Who is the provider or what is the name of the office in which the patient  attends annual eye exams? [facility] If pt is not established with a provider, would they like to be referred to a provider to establish care? No .   Dental Screening: Recommended annual dental exams for proper oral hygiene  Diabetic Foot Exam: n/a  Community Resource Referral / Chronic Care Management: CRR required this visit?  No   CCM required this visit?  No    Plan:     I have personally reviewed and noted the following in the patient's chart:   Medical and social history Use of alcohol, tobacco or illicit drugs  Current medications and supplements including opioid prescriptions. Patient is not currently taking opioid prescriptions. Functional ability and status Nutritional status Physical activity Advanced directives List of other physicians Hospitalizations, surgeries, and ER visits in previous 12 months Vitals Screenings to include cognitive, depression, and falls Referrals and appointments  In addition, I have reviewed and discussed with patient certain preventive protocols, quality metrics, and best practice recommendations. A written personalized care plan for preventive services as well as general preventive health recommendations were provided to patient.    Sue Lush, LPN   [id number]   After Visit Summary: (MyChart) Due to this being a telephonic visit, the after visit summary with patients personalized plan was offered to patient via MyChart   Nurse Notes: The patient states she  is doing well and has no concerns or questions at this time.

## 2023-02-03 NOTE — Patient Instructions (Signed)
Ms. Brittany Diaz , Thank you for taking time to come for your Medicare Wellness Visit. I appreciate your ongoing commitment to your health goals. Please review the following plan we discussed and let me know if I can assist you in the future.   Referrals/Orders/Follow-Ups/Clinician Recommendations: none  This is a list of the screening recommended for you and due dates:  Health Maintenance  Topic Date Due   DTaP/Tdap/Td vaccine (1 - Tdap) Never done   COVID-19 Vaccine (4 - 2024-25 season) 10/13/2022   Complete foot exam   04/16/2023   Yearly kidney health urinalysis for diabetes  04/18/2023   Hemoglobin A1C  06/05/2023   Eye exam for diabetics  06/07/2023   Yearly kidney function blood test for diabetes  12/05/2023   Medicare Annual Wellness Visit  02/03/2024   Cologuard (Stool DNA test)  03/08/2024   Mammogram  12/19/2024   Pneumonia Vaccine  Completed   Flu Shot  Completed   DEXA scan (bone density measurement)  Completed   Hepatitis C Screening  Completed   Zoster (Shingles) Vaccine  Completed   HPV Vaccine  Aged Out   Colon Cancer Screening  Discontinued    Advanced directives: (Copy Requested) Please bring a copy of your health care power of attorney and living will to the office to be added to your chart at your convenience.  Next Medicare Annual Wellness Visit scheduled for next year: Yes 02/11/2024 @ 9:10am telephone

## 2023-02-17 ENCOUNTER — Other Ambulatory Visit: Payer: Self-pay | Admitting: Internal Medicine

## 2023-02-17 DIAGNOSIS — E782 Mixed hyperlipidemia: Secondary | ICD-10-CM

## 2023-02-20 DIAGNOSIS — R059 Cough, unspecified: Secondary | ICD-10-CM | POA: Diagnosis not present

## 2023-02-20 DIAGNOSIS — J22 Unspecified acute lower respiratory infection: Secondary | ICD-10-CM | POA: Diagnosis not present

## 2023-02-26 ENCOUNTER — Other Ambulatory Visit: Payer: Self-pay | Admitting: Internal Medicine

## 2023-02-26 ENCOUNTER — Telehealth: Payer: Self-pay | Admitting: Internal Medicine

## 2023-02-26 DIAGNOSIS — L235 Allergic contact dermatitis due to other chemical products: Secondary | ICD-10-CM

## 2023-02-26 MED ORDER — TRIAMCINOLONE ACETONIDE 0.1 % EX CREA: 1.0000 | TOPICAL_CREAM | Freq: Two times a day (BID) | CUTANEOUS | 2 refills | Status: AC

## 2023-02-26 NOTE — Telephone Encounter (Signed)
Copied from CRM [id number]. Topic: Clinical - Medication Refill >> Feb 26, 2023  8:30 AM Fonda Kinder J wrote: Most Recent Primary Care Visit:  Provider: Sue Lush  Department: [facility]  Visit Type: MEDICARE AWV, SEQUENTIAL  Date: 02/03/2023  Medication: triamcinolone cream (KENALOG) 0.1 %  Has the patient contacted their pharmacy? No (Agent: If no, request that the patient contact the pharmacy for the refill. If patient does not wish to contact the pharmacy document the reason why and proceed with request.) (Agent: If yes, when and what did the pharmacy advise?) Pt states that was taking the medication in the past but the symptoms returned and she is asking for the medication again    Is this the correct pharmacy for this prescription? Yes If no, delete pharmacy and type the correct one.  This is the patient's preferred pharmacy:  [facility] [address] [place] - [address] [place] [address] [place] [address] Phone: [phone] Fax: [phone]    Has the prescription been filled recently? No  Is the patient out of the medication? Yes  Has the patient been seen for an appointment in the last year OR does the patient have an upcoming appointment? Yes  Can we respond through MyChart? No  Agent: Please be advised that Rx refills may take up to 3 business days. We ask that you follow-up with your pharmacy.

## 2023-04-08 ENCOUNTER — Encounter: Payer: Medicare HMO | Admitting: Internal Medicine

## 2023-05-01 ENCOUNTER — Other Ambulatory Visit: Payer: Self-pay | Admitting: Internal Medicine

## 2023-05-01 DIAGNOSIS — M1610 Unilateral primary osteoarthritis, unspecified hip: Secondary | ICD-10-CM

## 2023-05-09 ENCOUNTER — Other Ambulatory Visit: Payer: Self-pay | Admitting: Internal Medicine

## 2023-05-09 DIAGNOSIS — I10 Essential (primary) hypertension: Secondary | ICD-10-CM

## 2023-05-21 ENCOUNTER — Other Ambulatory Visit: Payer: Self-pay | Admitting: Internal Medicine

## 2023-05-21 DIAGNOSIS — I10 Essential (primary) hypertension: Secondary | ICD-10-CM

## 2023-06-02 ENCOUNTER — Encounter: Payer: Self-pay | Admitting: Internal Medicine

## 2023-06-02 ENCOUNTER — Ambulatory Visit (INDEPENDENT_AMBULATORY_CARE_PROVIDER_SITE_OTHER): Payer: Medicare HMO | Admitting: Internal Medicine

## 2023-06-02 VITALS — BP 129/79 | HR 78 | Ht 62.0 in | Wt 202.8 lb

## 2023-06-02 DIAGNOSIS — L708 Other acne: Secondary | ICD-10-CM | POA: Insufficient documentation

## 2023-06-02 DIAGNOSIS — I10 Essential (primary) hypertension: Secondary | ICD-10-CM

## 2023-06-02 DIAGNOSIS — E1169 Type 2 diabetes mellitus with other specified complication: Secondary | ICD-10-CM | POA: Diagnosis not present

## 2023-06-02 DIAGNOSIS — E039 Hypothyroidism, unspecified: Secondary | ICD-10-CM

## 2023-06-02 DIAGNOSIS — Z0001 Encounter for general adult medical examination with abnormal findings: Secondary | ICD-10-CM

## 2023-06-02 DIAGNOSIS — E782 Mixed hyperlipidemia: Secondary | ICD-10-CM | POA: Diagnosis not present

## 2023-06-02 DIAGNOSIS — E559 Vitamin D deficiency, unspecified: Secondary | ICD-10-CM

## 2023-06-02 MED ORDER — LANCETS MISC. MISC: 1.0000 | Freq: Three times a day (TID) | 1 refills | Status: AC

## 2023-06-02 MED ORDER — BLOOD GLUCOSE MONITORING SUPPL DEVI: 1.0000 | Freq: Three times a day (TID) | 0 refills | Status: AC

## 2023-06-02 MED ORDER — BLOOD GLUCOSE TEST VI STRP: 1.0000 | ORAL_STRIP | Freq: Three times a day (TID) | 1 refills | Status: AC

## 2023-06-02 NOTE — Assessment & Plan Note (Signed)
 Lab Results  Component Value Date   TSH 2.770 12/05/2022   On levothyroxine  75 mcg daily Recheck TSH and free T4

## 2023-06-02 NOTE — Assessment & Plan Note (Signed)
On Crestor Advised to follow cholesterol diet for now Check lipid profile

## 2023-06-02 NOTE — Progress Notes (Addendum)
 Established Patient Office Visit  Subjective:  Patient ID: Brittany Diaz, female    DOB: [birth year]  Age: 73 y.o. MRN: [id number]  CC:  Chief Complaint  Patient presents with   Annual Exam    Cpe/ 4 month f/u   Skin Problem    Pt reports a bump under her chin started a week ago has been hurting.     HPI Brittany Diaz is a 73 y.o. female with past medical history of HTN, hypothyroidism, osteopenia and generalized OA who presents for f/u of her chronic medical conditions.  HTN: BP is wnl today.  She has been taking Maxzide 37.5-25 mg QD and amlodipine  10 mg QD. Patient denies headache, dizziness, chest pain, dyspnea or palpitations.  Hypothyroidism: She has been taking levothyroxine  75 mcg QD regularly now.  She reports intermittent fatigue, but denies any recent change in appetite, tremors, vision problems, or recent hair changes.  Type II DM: Her last HbA1c was 6.8 in 10/24.  She admits that she had not been watching over her diet, but has cut down on soda and has been watching over carb content in her food. She wants to improve her diet before starting any medicine for DM.  HLD: She has been taking Crestor  regularly.  Left hip pain: She has chronic left hip pain, for which she has tried taking Celebrex  100 mg with mild relief.  She recently stopped taking it as it was not effective.  She applies local pain relieving cream with mild relief as well.  She has had x-ray of pelvis, which showed bilateral mild hip OA in 09/23.  She used to have right hip pain, which has improved with steroid injection in the past.  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  She reports a bump (acneform) to her left side of neck, which was initially red and bigger, but has been getting smaller now.  Past Medical History:  Diagnosis Date   Hypertension    Thyroid  disease     Past Surgical History:  Procedure Laterality Date   ABDOMINAL  HYSTERECTOMY     CESAREAN SECTION     ELBOW SURGERY      Family History  Problem Relation Age of Onset   Thyroid  disease Mother    Hypertension Mother    Hypertension Father    Diabetes Father    Breast cancer Daughter     Social History   Socioeconomic History   Marital status: Divorced    Spouse name: Not on file   Number of children: Not on file   Years of education: Not on file   Highest education level: Not on file  Occupational History   Not on file  Tobacco Use   Smoking status: Never   Smokeless tobacco: Never  Substance and Sexual Activity   Alcohol use: No   Drug use: No   Sexual activity: Never    Partners: Male    Birth control/protection: None  Other Topics Concern   Not on file  Social History Narrative   Not on file   Social Drivers of Health   Financial Resource Strain: Low Risk  (02/03/2023)   Overall Financial Resource Strain (CARDIA)    Difficulty of Paying Living Expenses: Not hard at all  Food Insecurity: No Food Insecurity (02/03/2023)   Hunger Vital Sign    Worried About Running Out of Food in the Last Year: Never true    Ran  Out of Food in the Last Year: Never true  Transportation Needs: No Transportation Needs (02/03/2023)   PRAPARE - Administrator, Civil Service (Medical): No    Lack of Transportation (Non-Medical): No  Physical Activity: Insufficiently Active (02/03/2023)   Exercise Vital Sign    Days of Exercise per Week: 3 days    Minutes of Exercise per Session: 20 min  Stress: No Stress Concern Present (02/03/2023)   [employer] of Occupational Health - Occupational Stress Questionnaire    Feeling of Stress : Not at all  Social Connections: Moderately Integrated (02/03/2023)   Social Connection and Isolation Panel [NHANES]    Frequency of Communication with Friends and Family: More than three times a week    Frequency of Social Gatherings with Friends and Family: More than three times a week    Attends  Religious Services: More than 4 times per year    Active Member of [employer] or Organizations: Yes    Attends Engineer, structural: More than 4 times per year    Marital Status: Divorced  Intimate Partner Violence: Not At Risk (02/03/2023)   Humiliation, Afraid, Rape, and Kick questionnaire    Fear of Current or Ex-Partner: No    Emotionally Abused: No    Physically Abused: No    Sexually Abused: No    Outpatient Medications Prior to Visit  Medication Sig Dispense Refill   albuterol  (PROVENTIL  HFA;VENTOLIN  HFA) 108 (90 Base) MCG/ACT inhaler Inhale 2 puffs into the lungs every 4 (four) hours as needed. 1 Inhaler 0   amLODipine  (NORVASC ) 10 MG tablet TAKE 1 TABLET EVERY DAY 90 tablet 3   celecoxib  (CELEBREX ) 200 MG capsule TAKE 1 CAPSULE EVERY DAY 90 capsule 3   cyclobenzaprine  (FLEXERIL ) 10 MG tablet Take 1 tablet (10 mg total) by mouth 2 (two) times daily as needed for muscle spasms. (Patient not taking: Reported on 02/03/2023) 20 tablet 0   diclofenac  Sodium (VOLTAREN ) 1 % GEL Apply 2 g topically 4 (four) times daily as needed. For right hand pain prn. 50 g 1   EPINEPHrine  0.3 mg/0.3 mL IJ SOAJ injection Inject 0.3 mg into the muscle as needed for anaphylaxis. 1 each 1   levothyroxine  (SYNTHROID ) 75 MCG tablet TAKE 1 TABLET EVERY DAY 90 tablet 3   rosuvastatin  (CRESTOR ) 5 MG tablet TAKE 1 TABLET EVERY DAY 90 tablet 3   triamcinolone  cream (KENALOG ) 0.1 % Apply 1 Application topically 2 (two) times daily. 60 g 2   triamterene -hydrochlorothiazide (MAXZIDE-25) 37.5-25 MG tablet TAKE 1 TABLET EVERY DAY 90 tablet 3   No facility-administered medications prior to visit.    No Known Allergies  ROS Review of Systems  Constitutional:  Negative for chills and fever.  HENT:  Negative for congestion, sinus pressure, sinus pain and sore throat.   Eyes:  Negative for pain and discharge.  Respiratory:  Negative for cough and shortness of breath.   Cardiovascular:  Negative for chest pain  and palpitations.  Gastrointestinal:  Negative for abdominal pain, diarrhea, nausea and vomiting.  Endocrine: Negative for polydipsia and polyuria.  Genitourinary:  Negative for dysuria and hematuria.  Musculoskeletal:  Positive for arthralgias (L hip) and back pain. Negative for neck pain and neck stiffness.  Skin:  Negative for rash.       Bump over left side of neck  Neurological:  Negative for dizziness and weakness.  Psychiatric/Behavioral:  Negative for agitation and behavioral problems.       Objective:  Physical Exam Vitals reviewed.  Constitutional:      General: She is not in acute distress.    Appearance: She is not diaphoretic.  HENT:     Head: Normocephalic and atraumatic.     Nose: Nose normal.     Mouth/Throat:     Mouth: Mucous membranes are moist.  Eyes:     General: No scleral icterus.    Extraocular Movements: Extraocular movements intact.  Cardiovascular:     Rate and Rhythm: Normal rate and regular rhythm.     Pulses: Normal pulses.     Heart sounds: Normal heart sounds. No murmur heard. Pulmonary:     Breath sounds: Normal breath sounds. No wheezing or rales.  Abdominal:     Palpations: Abdomen is soft.     Tenderness: There is no abdominal tenderness.  Musculoskeletal:     Cervical back: Neck supple. No tenderness.     Lumbar back: Tenderness present. Negative right straight leg raise test and negative left straight leg raise test.     Left hip: Tenderness present.     Right lower leg: No edema.     Left lower leg: No edema.  Skin:    General: Skin is warm.     Findings: Lesion (Acneform lesion over left side of neck) present. No rash.     Comments: Subcutaneous cyst over dorsal aspect of right forearm-about 2 cm in diameter, nontender, no surrounding erythema  Neurological:     General: No focal deficit present.     Mental Status: She is alert and oriented to person, place, and time.     Cranial Nerves: No cranial nerve deficit.     Sensory:  No sensory deficit.     Motor: No weakness.  Psychiatric:        Mood and Affect: Mood normal.        Behavior: Behavior normal.     BP 129/79   Pulse 78   Ht 5' 2 (1.575 m)   Wt 202 lb 12.8 oz (92 kg)   SpO2 95%   BMI 37.09 kg/m  Wt Readings from Last 3 Encounters:  06/02/23 202 lb 12.8 oz (92 kg)  02/03/23 191 lb (86.6 kg)  12/25/22 195 lb (88.5 kg)    Lab Results  Component Value Date   TSH 2.770 12/05/2022   Lab Results  Component Value Date   WBC 6.7 03/05/2022   HGB 13.4 03/05/2022   HCT 40.1 03/05/2022   MCV 83 03/05/2022   PLT 290 03/05/2022   Lab Results  Component Value Date   NA 138 12/05/2022   K 3.9 12/05/2022   CO2 22 12/05/2022   GLUCOSE 102 (H) 12/05/2022   BUN 15 12/05/2022   CREATININE 1.08 (H) 12/05/2022   BILITOT 0.3 12/05/2022   ALKPHOS 103 12/05/2022   AST 22 12/05/2022   ALT 18 12/05/2022   PROT 7.9 12/05/2022   ALBUMIN 4.6 12/05/2022   CALCIUM  10.1 12/05/2022   EGFR 55 (L) 12/05/2022   Lab Results  Component Value Date   CHOL 192 08/08/2022   Lab Results  Component Value Date   HDL 46 08/08/2022   Lab Results  Component Value Date   LDLCALC 123 (H) 08/08/2022   Lab Results  Component Value Date   TRIG 126 08/08/2022   Lab Results  Component Value Date   CHOLHDL 4.2 08/08/2022   Lab Results  Component Value Date   HGBA1C 6.8 (H) 12/05/2022      Assessment &  Plan:   Problem List Items Addressed This Visit       Cardiovascular and Mediastinum   Essential hypertension   BP Readings from Last 1 Encounters:  06/02/23 129/79   Well-controlled with Maxzide 37.5-25 mg and Amlodipine  10 mg QD Counseled for compliance with the medications Advised DASH diet and moderate exercise/walking as tolerated      Relevant Orders   CMP14+EGFR   CBC with Differential/Platelet     Endocrine   Hypothyroidism   Lab Results  Component Value Date   TSH 2.770 12/05/2022   On levothyroxine  75 mcg daily Recheck TSH and  free T4      Relevant Orders   TSH + free T4   Type 2 diabetes mellitus with other specified complication (HCC)   Lab Results  Component Value Date   HGBA1C 6.8 (H) 12/05/2022   Overall well-controlled with diet modification Associated with HTN and HLD Advised to follow diabetic diet On statin F/u CMP and lipid panel Diabetic eye exam: Advised to follow up with Ophthalmology for diabetic eye exam      Relevant Medications   Blood Glucose Monitoring Suppl DEVI   Glucose Blood (BLOOD GLUCOSE TEST STRIPS) STRP   Lancets Misc. MISC   Other Relevant Orders   Microalbumin / creatinine urine ratio   Hemoglobin A1c   CMP14+EGFR     Musculoskeletal and Integument   Acneiform eruption   Over the neck area -reassured it being benign        Other   Vitamin D  deficiency   Last vitamin D  Lab Results  Component Value Date   VD25OH 46.5 03/05/2022   Continue vitamin D  supplement      Relevant Orders   VITAMIN D  25 Hydroxy (Vit-D Deficiency, Fractures)   Encounter for general adult medical examination with abnormal findings - Primary   Physical exam as documented. Counseling done  re healthy lifestyle involving commitment to 150 minutes exercise per week, heart healthy diet, and attaining healthy weight.The importance of adequate sleep also discussed. Immunization and cancer screening needs are specifically addressed at this visit.      Mixed hyperlipidemia   On Crestor  Advised to follow cholesterol diet for now Check lipid profile      Relevant Orders   Lipid panel    Meds ordered this encounter  Medications   Blood Glucose Monitoring Suppl DEVI    Sig: 1 each by Does not apply route in the morning, at noon, and at bedtime. May substitute to any manufacturer covered by patient's insurance.    Dispense:  1 each    Refill:  0   Glucose Blood (BLOOD GLUCOSE TEST STRIPS) STRP    Sig: 1 each by In Vitro route in the morning, at noon, and at bedtime. May substitute to  any manufacturer covered by patient's insurance.    Dispense:  100 strip    Refill:  1   Lancets Misc. MISC    Sig: 1 each by Does not apply route in the morning, at noon, and at bedtime. May substitute to any manufacturer covered by patient's insurance.    Dispense:  100 each    Refill:  1    Follow-up: Return in about 4 months (around 10/02/2023) for HTN and DM.    Brittany MARLA Blanch, MD

## 2023-06-02 NOTE — Assessment & Plan Note (Signed)
 Lab Results  Component Value Date   HGBA1C 6.8 (H) 12/05/2022   Overall well-controlled with diet modification Associated with HTN and HLD Advised to follow diabetic diet On statin F/u CMP and lipid panel Diabetic eye exam: Advised to follow up with Ophthalmology for diabetic eye exam

## 2023-06-02 NOTE — Assessment & Plan Note (Signed)
 BP Readings from Last 1 Encounters:  06/02/23 129/79   Well-controlled with Maxzide 37.5-25 mg and Amlodipine  10 mg QD Counseled for compliance with the medications Advised DASH diet and moderate exercise/walking as tolerated

## 2023-06-02 NOTE — Assessment & Plan Note (Signed)
 Physical exam as documented. Counseling done  re healthy lifestyle involving commitment to 150 minutes exercise per week, heart healthy diet, and attaining healthy weight.The importance of adequate sleep also discussed. Immunization and cancer screening needs are specifically addressed at this visit.

## 2023-06-02 NOTE — Patient Instructions (Signed)
 Please continue to take medications as prescribed.  Please continue to follow low carb diet and perform moderate exercise/walking at least 150 mins/week.

## 2023-06-02 NOTE — Assessment & Plan Note (Signed)
 Last vitamin D  Lab Results  Component Value Date   VD25OH 46.5 03/05/2022   Continue vitamin D  supplement

## 2023-06-02 NOTE — Assessment & Plan Note (Signed)
 Over the neck area -reassured it being benign

## 2023-06-03 ENCOUNTER — Encounter: Payer: Self-pay | Admitting: Internal Medicine

## 2023-06-03 LAB — LIPID PANEL
Chol/HDL Ratio: 4 ratio (ref 0.0–4.4)
Cholesterol, Total: 201 mg/dL — ABNORMAL HIGH (ref 100–199)
HDL: 50 mg/dL (ref >39)
LDL Chol Calc (NIH): 132 mg/dL — ABNORMAL HIGH (ref 0–99)
Triglycerides: 107 mg/dL (ref 0–149)
VLDL Cholesterol Cal: 19 mg/dL (ref 5–40)

## 2023-06-03 LAB — CBC WITH DIFFERENTIAL/PLATELET
Basophils Absolute: 0.1 10*3/uL (ref 0.0–0.2)
Basos: 1 %
EOS (ABSOLUTE): 0.2 10*3/uL (ref 0.0–0.4)
Eos: 3 %
Hematocrit: 43.5 % (ref 34.0–46.6)
Hemoglobin: 14.1 g/dL (ref 11.1–15.9)
Immature Grans (Abs): 0 10*3/uL (ref 0.0–0.1)
Immature Granulocytes: 0 %
Lymphocytes Absolute: 1.8 10*3/uL (ref 0.7–3.1)
Lymphs: 24 %
MCH: 27.6 pg (ref 26.6–33.0)
MCHC: 32.4 g/dL (ref 31.5–35.7)
MCV: 85 fL (ref 79–97)
Monocytes Absolute: 0.6 10*3/uL (ref 0.1–0.9)
Monocytes: 8 %
Neutrophils Absolute: 4.7 10*3/uL (ref 1.4–7.0)
Neutrophils: 64 %
Platelets: 333 10*3/uL (ref 150–450)
RBC: 5.11 x10E6/uL (ref 3.77–5.28)
RDW: 14.4 % (ref 11.7–15.4)
WBC: 7.3 10*3/uL (ref 3.4–10.8)

## 2023-06-03 LAB — CMP14+EGFR
ALT: 17 IU/L (ref 0–32)
AST: 21 IU/L (ref 0–40)
Albumin: 4.6 g/dL (ref 3.8–4.8)
Alkaline Phosphatase: 111 IU/L (ref 44–121)
BUN/Creatinine Ratio: 13 (ref 12–28)
BUN: 14 mg/dL (ref 8–27)
Bilirubin Total: <0.2 mg/dL (ref 0.0–1.2)
CO2: 22 mmol/L (ref 20–29)
Calcium: 9.8 mg/dL (ref 8.7–10.3)
Chloride: 102 mmol/L (ref 96–106)
Creatinine, Ser: 1.06 mg/dL — ABNORMAL HIGH (ref 0.57–1.00)
Globulin, Total: 3 g/dL (ref 1.5–4.5)
Glucose: 103 mg/dL — ABNORMAL HIGH (ref 70–99)
Potassium: 4.2 mmol/L (ref 3.5–5.2)
Sodium: 141 mmol/L (ref 134–144)
Total Protein: 7.6 g/dL (ref 6.0–8.5)
eGFR: 56 mL/min/{1.73_m2} — ABNORMAL LOW (ref >59)

## 2023-06-03 LAB — HEMOGLOBIN A1C
Est. average glucose Bld gHb Est-mCnc: 148 mg/dL
Hgb A1c MFr Bld: 6.8 % — ABNORMAL HIGH (ref 4.8–5.6)

## 2023-06-03 LAB — TSH+FREE T4
Free T4: 1.34 ng/dL (ref 0.82–1.77)
TSH: 3.08 u[IU]/mL (ref 0.450–4.500)

## 2023-06-03 LAB — VITAMIN D 25 HYDROXY (VIT D DEFICIENCY, FRACTURES): Vit D, 25-Hydroxy: 21.2 ng/mL — ABNORMAL LOW (ref 30.0–100.0)

## 2023-06-03 MED ORDER — ROSUVASTATIN CALCIUM 10 MG PO TABS: 10.0000 mg | ORAL_TABLET | Freq: Every day | ORAL | 3 refills | Status: AC

## 2023-06-03 NOTE — Addendum Note (Signed)
 Addended byCleola Dach on: 06/03/2023 07:57 AM   Modules accepted: Orders

## 2023-06-05 LAB — MICROALBUMIN / CREATININE URINE RATIO
Creatinine, Urine: 43.7 mg/dL
Microalb/Creat Ratio: <7 mg/g{creat} (ref 0–29)
Microalbumin, Urine: <3 ug/mL

## 2023-08-06 ENCOUNTER — Other Ambulatory Visit: Payer: Self-pay | Admitting: Internal Medicine

## 2023-08-29 ENCOUNTER — Encounter: Payer: Self-pay | Admitting: Advanced Practice Midwife

## 2023-10-06 ENCOUNTER — Other Ambulatory Visit (HOSPITAL_COMMUNITY): Payer: Self-pay | Admitting: Internal Medicine

## 2023-10-06 ENCOUNTER — Encounter: Payer: Self-pay | Admitting: Internal Medicine

## 2023-10-06 ENCOUNTER — Other Ambulatory Visit: Payer: Self-pay | Admitting: Internal Medicine

## 2023-10-06 ENCOUNTER — Ambulatory Visit (INDEPENDENT_AMBULATORY_CARE_PROVIDER_SITE_OTHER): Admitting: Internal Medicine

## 2023-10-06 VITALS — BP 131/79 | HR 74 | Ht 62.0 in | Wt 194.0 lb

## 2023-10-06 DIAGNOSIS — E039 Hypothyroidism, unspecified: Secondary | ICD-10-CM

## 2023-10-06 DIAGNOSIS — E782 Mixed hyperlipidemia: Secondary | ICD-10-CM | POA: Diagnosis not present

## 2023-10-06 DIAGNOSIS — M1612 Unilateral primary osteoarthritis, left hip: Secondary | ICD-10-CM | POA: Diagnosis not present

## 2023-10-06 DIAGNOSIS — Z1231 Encounter for screening mammogram for malignant neoplasm of breast: Secondary | ICD-10-CM

## 2023-10-06 DIAGNOSIS — I10 Essential (primary) hypertension: Secondary | ICD-10-CM

## 2023-10-06 DIAGNOSIS — N1831 Chronic kidney disease, stage 3a: Secondary | ICD-10-CM | POA: Diagnosis not present

## 2023-10-06 DIAGNOSIS — E1169 Type 2 diabetes mellitus with other specified complication: Secondary | ICD-10-CM

## 2023-10-06 NOTE — Assessment & Plan Note (Signed)
 Lab Results  Component Value Date   HGBA1C 6.8 (H) 06/02/2023   Overall well-controlled with diet modification Associated with HTN and HLD Advised to follow diabetic diet On statin F/u CMP and lipid panel Diabetic eye exam: Advised to follow up with Ophthalmology for diabetic eye exam

## 2023-10-06 NOTE — Assessment & Plan Note (Signed)
 Lab Results  Component Value Date   TSH 3.080 06/02/2023   On levothyroxine  75 mcg daily Recheck TSH and free T4 later

## 2023-10-06 NOTE — Assessment & Plan Note (Signed)
 BP Readings from Last 1 Encounters:  10/06/23 131/79   Well-controlled with Maxzide 37.5-25 mg and Amlodipine  10 mg QD Counseled for compliance with the medications Advised DASH diet and moderate exercise/walking as tolerated

## 2023-10-06 NOTE — Assessment & Plan Note (Signed)
 X-ray of hip showed mild arthritis Needs to take Celebrex  200 mg PRN only instead of QD, needs to alternate with Tylenol arthritis Advised to apply ice over the left hip area Referred to orthopedic surgeon

## 2023-10-06 NOTE — Assessment & Plan Note (Addendum)
 On Crestor  10 mg QD Advised to follow cholesterol diet for now Check lipid profile

## 2023-10-06 NOTE — Progress Notes (Signed)
 Established Patient Office Visit  Subjective:  Patient ID: Brittany Diaz, female    DOB: Mar 23, 1950  Age: 73 y.o. MRN: [id number]  CC:  Chief Complaint  Patient presents with   Care Management    Follow up    HPI Brittany Diaz is a 73 y.o. female with past medical history of HTN, hypothyroidism, osteopenia and generalized OA who presents for f/u of her chronic medical conditions.  HTN: BP is wnl today.  She has been taking Maxzide 37.5-25 mg QD and amlodipine  10 mg QD. Patient denies headache, dizziness, chest pain, dyspnea or palpitations.  Hypothyroidism: She has been taking levothyroxine  75 mcg QD regularly now.  She reports intermittent fatigue, but denies any recent change in appetite, tremors, vision problems, or recent hair changes.  Type II DM: Her last HbA1c was 6.8 in 04/25.  She admits that she had not been watching over her diet, but has cut down on soda and has been watching over carb content in her food. She wants to improve her diet before starting any medicine for DM.  HLD: She has been taking Crestor  regularly.  Left hip pain: She has chronic left hip pain, for which she has tried taking Celebrex  100 mg with mild relief.  She applies local pain relieving cream with mild relief as well.  She has had x-ray of pelvis, which showed bilateral mild hip OA in 09/23.  She used to have right hip pain, which has improved with steroid injection in the past.  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  Past Medical History:  Diagnosis Date   Hypertension    Thyroid  disease     Past Surgical History:  Procedure Laterality Date   ABDOMINAL HYSTERECTOMY     CESAREAN SECTION     ELBOW SURGERY      Family History  Problem Relation Age of Onset   Thyroid  disease Mother    Hypertension Mother    Hypertension Father    Diabetes Father    Breast cancer Daughter     Social History   Socioeconomic History    Marital status: Divorced    Spouse name: Not on file   Number of children: Not on file   Years of education: Not on file   Highest education level: Not on file  Occupational History   Not on file  Tobacco Use   Smoking status: Never   Smokeless tobacco: Never  Substance and Sexual Activity   Alcohol use: No   Drug use: No   Sexual activity: Never    Partners: Male    Birth control/protection: None  Other Topics Concern   Not on file  Social History Narrative   Not on file   Social Drivers of Health   Financial Resource Strain: Low Risk  (02/03/2023)   Overall Financial Resource Strain (CARDIA)    Difficulty of Paying Living Expenses: Not hard at all  Food Insecurity: No Food Insecurity (02/03/2023)   Hunger Vital Sign    Worried About Running Out of Food in the Last Year: Never true    Ran Out of Food in the Last Year: Never true  Transportation Needs: No Transportation Needs (02/03/2023)   PRAPARE - Administrator, Civil Service (Medical): No    Lack of Transportation (Non-Medical): No  Physical Activity: Insufficiently Active (02/03/2023)   Exercise Vital Sign    Days of Exercise per Week: 3 days  Minutes of Exercise per Session: 20 min  Stress: No Stress Concern Present (02/03/2023)   [employer] of Occupational Health - Occupational Stress Questionnaire    Feeling of Stress : Not at all  Social Connections: Moderately Integrated (02/03/2023)   Social Connection and Isolation Panel    Frequency of Communication with Friends and Family: More than three times a week    Frequency of Social Gatherings with Friends and Family: More than three times a week    Attends Religious Services: More than 4 times per year    Active Member of [employer] or Organizations: Yes    Attends Engineer, structural: More than 4 times per year    Marital Status: Divorced  Intimate Partner Violence: Not At Risk (02/03/2023)   Humiliation, Afraid, Rape, and Kick  questionnaire    Fear of Current or Ex-Partner: No    Emotionally Abused: No    Physically Abused: No    Sexually Abused: No    Outpatient Medications Prior to Visit  Medication Sig Dispense Refill   albuterol  (PROVENTIL  HFA;VENTOLIN  HFA) 108 (90 Base) MCG/ACT inhaler Inhale 2 puffs into the lungs every 4 (four) hours as needed. 1 Inhaler 0   amLODipine  (NORVASC ) 10 MG tablet TAKE 1 TABLET EVERY DAY 90 tablet 3   Blood Glucose Monitoring Suppl DEVI 1 each by Does not apply route in the morning, at noon, and at bedtime. May substitute to any manufacturer covered by patient's insurance. 1 each 0   celecoxib  (CELEBREX ) 200 MG capsule TAKE 1 CAPSULE EVERY DAY 90 capsule 3   cyclobenzaprine  (FLEXERIL ) 10 MG tablet Take 1 tablet (10 mg total) by mouth 2 (two) times daily as needed for muscle spasms. (Patient not taking: Reported on 02/03/2023) 20 tablet 0   diclofenac  Sodium (VOLTAREN ) 1 % GEL Apply 2 g topically 4 (four) times daily as needed. For right hand pain prn. 50 g 1   EPINEPHrine  0.3 mg/0.3 mL IJ SOAJ injection Inject 0.3 mg into the muscle as needed for anaphylaxis. 1 each 1   Glucose Blood (BLOOD GLUCOSE TEST STRIPS) STRP 1 each by In Vitro route in the morning, at noon, and at bedtime. May substitute to any manufacturer covered by patient's insurance. 100 strip 1   Lancets Misc. MISC 1 each by Does not apply route in the morning, at noon, and at bedtime. May substitute to any manufacturer covered by patient's insurance. 100 each 1   levothyroxine  (SYNTHROID ) 75 MCG tablet TAKE 1 TABLET EVERY DAY 90 tablet 3   rosuvastatin  (CRESTOR ) 10 MG tablet Take 1 tablet (10 mg total) by mouth daily. 90 tablet 3   triamcinolone  cream (KENALOG ) 0.1 % Apply 1 Application topically 2 (two) times daily. 60 g 2   triamterene -hydrochlorothiazide (MAXZIDE-25) 37.5-25 MG tablet TAKE 1 TABLET EVERY DAY 90 tablet 3   TRUEplus Lancets 33G MISC TEST BLOOD SUGAR IN THE MORNING, AT NOON, AND AT BEDTIME. 200 each 0    No facility-administered medications prior to visit.    No Known Allergies  ROS Review of Systems  Constitutional:  Negative for chills and fever.  HENT:  Negative for congestion, sinus pressure, sinus pain and sore throat.   Eyes:  Negative for pain and discharge.  Respiratory:  Negative for cough and shortness of breath.   Cardiovascular:  Negative for chest pain and palpitations.  Gastrointestinal:  Negative for abdominal pain, diarrhea, nausea and vomiting.  Endocrine: Negative for polydipsia and polyuria.  Genitourinary:  Negative for dysuria and  hematuria.  Musculoskeletal:  Positive for arthralgias (L hip) and back pain. Negative for neck pain and neck stiffness.  Skin:  Negative for rash.       Bump over left side of neck  Neurological:  Negative for dizziness and weakness.  Psychiatric/Behavioral:  Negative for agitation and behavioral problems.       Objective:    Physical Exam Vitals reviewed.  Constitutional:      General: She is not in acute distress.    Appearance: She is not diaphoretic.  HENT:     Head: Normocephalic and atraumatic.     Nose: Nose normal.     Mouth/Throat:     Mouth: Mucous membranes are moist.  Eyes:     General: No scleral icterus.    Extraocular Movements: Extraocular movements intact.  Cardiovascular:     Rate and Rhythm: Normal rate and regular rhythm.     Heart sounds: Normal heart sounds. No murmur heard. Pulmonary:     Breath sounds: Normal breath sounds. No wheezing or rales.  Abdominal:     Palpations: Abdomen is soft.     Tenderness: There is no abdominal tenderness.  Musculoskeletal:     Cervical back: Neck supple. No tenderness.     Lumbar back: Tenderness present. Negative right straight leg raise test and negative left straight leg raise test.     Left hip: Tenderness present.     Right lower leg: No edema.     Left lower leg: No edema.  Skin:    General: Skin is warm.     Findings: No rash.     Comments:  Subcutaneous cyst over dorsal aspect of right forearm-about 2 cm in diameter, nontender, no surrounding erythema  Neurological:     General: No focal deficit present.     Mental Status: She is alert and oriented to person, place, and time.     Sensory: No sensory deficit.     Motor: No weakness.  Psychiatric:        Mood and Affect: Mood normal.        Behavior: Behavior normal.     BP 131/79   Pulse 74   Ht 5' 2 (1.575 m)   Wt 194 lb (88 kg)   SpO2 96%   BMI 35.48 kg/m  Wt Readings from Last 3 Encounters:  10/06/23 194 lb (88 kg)  06/02/23 202 lb 12.8 oz (92 kg)  02/03/23 191 lb (86.6 kg)    Lab Results  Component Value Date   TSH 3.080 06/02/2023   Lab Results  Component Value Date   WBC 7.3 06/02/2023   HGB 14.1 06/02/2023   HCT 43.5 06/02/2023   MCV 85 06/02/2023   PLT 333 06/02/2023   Lab Results  Component Value Date   NA 141 06/02/2023   K 4.2 06/02/2023   CO2 22 06/02/2023   GLUCOSE 103 (H) 06/02/2023   BUN 14 06/02/2023   CREATININE 1.06 (H) 06/02/2023   BILITOT <0.2 06/02/2023   ALKPHOS 111 06/02/2023   AST 21 06/02/2023   ALT 17 06/02/2023   PROT 7.6 06/02/2023   ALBUMIN 4.6 06/02/2023   CALCIUM  9.8 06/02/2023   EGFR 56 (L) 06/02/2023   Lab Results  Component Value Date   CHOL 201 (H) 06/02/2023   Lab Results  Component Value Date   HDL 50 06/02/2023   Lab Results  Component Value Date   LDLCALC 132 (H) 06/02/2023   Lab Results  Component Value Date  TRIG 107 06/02/2023   Lab Results  Component Value Date   CHOLHDL 4.0 06/02/2023   Lab Results  Component Value Date   HGBA1C 6.8 (H) 06/02/2023      Assessment & Plan:   Problem List Items Addressed This Visit       Cardiovascular and Mediastinum   Essential hypertension - Primary   BP Readings from Last 1 Encounters:  10/06/23 131/79   Well-controlled with Maxzide 37.5-25 mg and Amlodipine  10 mg QD Counseled for compliance with the medications Advised DASH diet and  moderate exercise/walking as tolerated        Endocrine   Hypothyroidism   Lab Results  Component Value Date   TSH 3.080 06/02/2023   On levothyroxine  75 mcg daily Recheck TSH and free T4 later      Type 2 diabetes mellitus with other specified complication (HCC)   Lab Results  Component Value Date   HGBA1C 6.8 (H) 06/02/2023   Overall well-controlled with diet modification Associated with HTN and HLD Advised to follow diabetic diet On statin F/u CMP and lipid panel Diabetic eye exam: Advised to follow up with Ophthalmology for diabetic eye exam      Relevant Orders   CMP14+EGFR   Hemoglobin A1c     Musculoskeletal and Integument   Hip arthritis   X-ray of hip showed mild arthritis Needs to take Celebrex  200 mg PRN only instead of QD, needs to alternate with Tylenol arthritis Advised to apply ice over the left hip area Referred to orthopedic surgeon        Genitourinary   Stage 3a chronic kidney disease (HCC)   Last 2 CMP showed reduced GFR, around 55 Advised to increase fluid intake Avoid nephrotoxic agents Advised to avoid Celebrex  on daily basis and try to alternate with Tylenol arthritis if needed for arthritic pain Recheck CMP and CBC      Relevant Orders   CBC with Differential/Platelet   CMP14+EGFR     Other   Mixed hyperlipidemia   On Crestor  10 mg QD Advised to follow cholesterol diet for now Check lipid profile      Relevant Orders   Lipid Profile     No orders of the defined types were placed in this encounter.   Follow-up: Return in about 4 months (around 02/05/2024) for DM and CKD.    Suzzane MARLA Blanch, MD

## 2023-10-06 NOTE — Assessment & Plan Note (Signed)
 Last 2 CMP showed reduced GFR, around 55 Advised to increase fluid intake Avoid nephrotoxic agents Advised to avoid Celebrex  on daily basis and try to alternate with Tylenol arthritis if needed for arthritic pain Recheck CMP and CBC

## 2023-10-06 NOTE — Patient Instructions (Addendum)
 Please continue to take medications as prescribed.  Please continue to follow low carb diet and perform moderate exercise/walking as tolerated.  Please maintain at least 64 ounces of fluid intake in a day.

## 2023-10-07 ENCOUNTER — Ambulatory Visit: Payer: Self-pay | Admitting: Internal Medicine

## 2023-10-07 LAB — CBC WITH DIFFERENTIAL/PLATELET
Basophils Absolute: 0.1 x10E3/uL (ref 0.0–0.2)
Basos: 1 %
EOS (ABSOLUTE): 0.3 x10E3/uL (ref 0.0–0.4)
Eos: 4 %
Hematocrit: 46.8 % — ABNORMAL HIGH (ref 34.0–46.6)
Hemoglobin: 14.6 g/dL (ref 11.1–15.9)
Immature Grans (Abs): 0 x10E3/uL (ref 0.0–0.1)
Immature Granulocytes: 0 %
Lymphocytes Absolute: 1.6 x10E3/uL (ref 0.7–3.1)
Lymphs: 23 %
MCH: 26.7 pg (ref 26.6–33.0)
MCHC: 31.2 g/dL — ABNORMAL LOW (ref 31.5–35.7)
MCV: 86 fL (ref 79–97)
Monocytes Absolute: 0.6 x10E3/uL (ref 0.1–0.9)
Monocytes: 8 %
Neutrophils Absolute: 4.6 x10E3/uL (ref 1.4–7.0)
Neutrophils: 64 %
Platelets: 354 x10E3/uL (ref 150–450)
RBC: 5.46 x10E6/uL — ABNORMAL HIGH (ref 3.77–5.28)
RDW: 15.7 % — ABNORMAL HIGH (ref 11.7–15.4)
WBC: 7.1 x10E3/uL (ref 3.4–10.8)

## 2023-10-07 LAB — HEMOGLOBIN A1C
Est. average glucose Bld gHb Est-mCnc: 143 mg/dL
Hgb A1c MFr Bld: 6.6 % — ABNORMAL HIGH (ref 4.8–5.6)

## 2023-10-07 LAB — LIPID PANEL
Chol/HDL Ratio: 4.3 ratio (ref 0.0–4.4)
Cholesterol, Total: 201 mg/dL — ABNORMAL HIGH (ref 100–199)
HDL: 47 mg/dL (ref >39)
LDL Chol Calc (NIH): 137 mg/dL — ABNORMAL HIGH (ref 0–99)
Triglycerides: 96 mg/dL (ref 0–149)
VLDL Cholesterol Cal: 17 mg/dL (ref 5–40)

## 2023-10-07 LAB — CMP14+EGFR
ALT: 15 IU/L (ref 0–32)
AST: 18 IU/L (ref 0–40)
Albumin: 4.6 g/dL (ref 3.8–4.8)
Alkaline Phosphatase: 111 IU/L (ref 44–121)
BUN/Creatinine Ratio: 15 (ref 12–28)
BUN: 16 mg/dL (ref 8–27)
Bilirubin Total: 0.4 mg/dL (ref 0.0–1.2)
CO2: 22 mmol/L (ref 20–29)
Calcium: 10.1 mg/dL (ref 8.7–10.3)
Chloride: 97 mmol/L (ref 96–106)
Creatinine, Ser: 1.05 mg/dL — ABNORMAL HIGH (ref 0.57–1.00)
Globulin, Total: 3.1 g/dL (ref 1.5–4.5)
Glucose: 106 mg/dL — ABNORMAL HIGH (ref 70–99)
Potassium: 4 mmol/L (ref 3.5–5.2)
Sodium: 138 mmol/L (ref 134–144)
Total Protein: 7.7 g/dL (ref 6.0–8.5)
eGFR: 57 mL/min/1.73 — ABNORMAL LOW (ref >59)

## 2023-12-05 ENCOUNTER — Other Ambulatory Visit: Payer: Self-pay | Admitting: Internal Medicine

## 2023-12-22 ENCOUNTER — Encounter (HOSPITAL_COMMUNITY): Payer: Self-pay

## 2023-12-22 ENCOUNTER — Ambulatory Visit (HOSPITAL_COMMUNITY)
Admission: RE | Admit: 2023-12-22 | Discharge: 2023-12-22 | Disposition: A | Discharge status: Home / Self Care | Source: Ambulatory Visit | Attending: Internal Medicine | Admitting: Internal Medicine

## 2023-12-22 DIAGNOSIS — Z1231 Encounter for screening mammogram for malignant neoplasm of breast: Secondary | ICD-10-CM | POA: Diagnosis not present

## 2024-02-02 ENCOUNTER — Other Ambulatory Visit: Payer: Self-pay | Admitting: Internal Medicine

## 2024-02-09 ENCOUNTER — Ambulatory Visit: Payer: Medicare HMO

## 2024-02-09 VITALS — BP 120/80 | Ht 63.0 in | Wt 193.0 lb

## 2024-02-09 DIAGNOSIS — Z0000 Encounter for general adult medical examination without abnormal findings: Secondary | ICD-10-CM | POA: Diagnosis not present

## 2024-02-09 DIAGNOSIS — Z780 Asymptomatic menopausal state: Secondary | ICD-10-CM | POA: Diagnosis not present

## 2024-02-09 NOTE — Patient Instructions (Signed)
 Ms. Pepin,  Thank you for taking the time for your Medicare Wellness Visit. I appreciate your continued commitment to your health goals. Please review the care plan we discussed, and feel free to reach out if I can assist you further.  Please note that Annual Wellness Visits do not include a physical exam. Some assessments may be limited, especially if the visit was conducted virtually. If needed, we may recommend an in-person follow-up with your provider.  Ongoing Care Seeing your primary care provider every 3 to 6 months helps us  monitor your health and provide consistent, personalized care.   1 year follow up for Medicare well visit: Wednesday February 09, 2025 at 9:20am with medicare wellness nurse in office  Referrals If a referral was made during today's visit and you haven't received any updates within two weeks, please contact the referred provider directly to check on the status.  Osteoporosis Screening An order was placed for you to have your Osteoporosis Screening. Call the number below to schedule that [facility]  [phone]   Recommended Screenings:  Health Maintenance  Topic Date Due   DTaP/Tdap/Td vaccine (1 - Tdap) Never done   Eye exam for diabetics  06/07/2023   COVID-19 Vaccine (4 - 2025-26 season) 10/13/2023   Osteoporosis screening with Bone Density Scan  11/10/2023   Medicare Annual Wellness Visit  02/03/2024   Cologuard (Stool DNA test)  03/08/2024   Hemoglobin A1C  04/07/2024   Yearly kidney health urinalysis for diabetes  06/01/2024   Complete foot exam   06/01/2024   Yearly kidney function blood test for diabetes  10/05/2024   Breast Cancer Screening  12/21/2024   Pneumococcal Vaccine for age over 55  Completed   Flu Shot  Completed   Hepatitis C Screening  Completed   Zoster (Shingles) Vaccine  Completed   Meningitis B Vaccine  Aged Out   Colon Cancer Screening  Discontinued       02/09/2024    9:36 AM  Advanced Directives  Does Patient  Have a Medical Advance Directive? No  Would patient like information on creating a medical advance directive? Yes (MAU/Ambulatory/Procedural Areas - Information given)    Vision: Annual vision screenings are recommended for early detection of glaucoma, cataracts, and diabetic retinopathy. These exams can also reveal signs of chronic conditions such as diabetes and high blood pressure.  Dental: Annual dental screenings help detect early signs of oral cancer, gum disease, and other conditions linked to overall health, including heart disease and diabetes.  Please see the attached documents for additional preventive care recommendations.

## 2024-02-09 NOTE — Progress Notes (Signed)
 "  HM Addressed: DEXA ordered  Chief Complaint  Patient presents with   Medicare Wellness     Subjective:   Brittany Diaz is a 73 y.o. female who presents for a Medicare Annual Wellness Visit.  Visit info / Clinical Intake: Medicare Wellness Visit Type:: Subsequent Annual Wellness Visit Persons participating in visit and providing information:: patient Medicare Wellness Visit Mode:: Telephone If telephone:: video declined Since this visit was completed virtually, some vitals may be partially provided or unavailable. Missing vitals are due to the limitations of the virtual format.: Documented vitals are patient reported If Telephone or Video please confirm:: I connected with patient using audio/video enable telemedicine. I verified patient identity with two identifiers, discussed telehealth limitations, and patient agreed to proceed. Patient Location:: home Provider Location:: office Interpreter Needed?: No Pre-visit prep was completed: yes AWV questionnaire completed by patient prior to visit?: no Living arrangements:: with family/others Patient's Overall Health Status Rating: very good Typical amount of pain: none Does pain affect daily life?: no Are you currently prescribed opioids?: no  Dietary Habits and Nutritional Risks How many meals a day?: 3 Eats fruit and vegetables daily?: yes Most meals are obtained by: preparing own meals In the last 2 weeks, have you had any of the following?: none Diabetic:: (!) yes Any non-healing wounds?: no How often do you check your BS?: 2 Would you like to be referred to a Nutritionist or for Diabetic Management? : no  Functional Status Activities of Daily Living (to include ambulation/medication): Independent Ambulation: Independent Medication Administration: Independent Home Management (perform basic housework or laundry): Independent Manage your own finances?: yes Primary transportation is: driving Concerns about vision?: no  *vision screening is required for WTM* Concerns about hearing?: no  Fall Screening Falls in the past year?: 0 Number of falls in past year: 0 Was there an injury with Fall?: 0 Fall Risk Category Calculator: 0 Patient Fall Risk Level: Low Fall Risk  Fall Risk Patient at Risk for Falls Due to: No Fall Risks Fall risk Follow up: Falls evaluation completed; Education provided; Falls prevention discussed  Home and Transportation Safety: All rugs have non-skid backing?: yes All stairs or steps have railings?: yes Grab bars in the bathtub or shower?: yes Have non-skid surface in bathtub or shower?: yes Good home lighting?: yes Regular seat belt use?: yes Hospital stays in the last year:: no  Cognitive Assessment Difficulty concentrating, remembering, or making decisions? : no Will 6CIT or Mini Cog be Completed: yes What year is it?: 0 points What month is it?: 0 points Give patient an address phrase to remember (5 components): [address] [place] About what time is it?: 0 points Count backwards from 20 to 1: 0 points Say the months of the year in reverse: 0 points Repeat the address phrase from earlier: 0 points 6 CIT Score: 0 points  Advance Directives (For Healthcare) Does Patient Have a Medical Advance Directive?: No Would patient like information on creating a medical advance directive?: Yes (MAU/Ambulatory/Procedural Areas - Information given)  Reviewed/Updated  Reviewed/Updated: Reviewed All (Medical, Surgical, Family, Medications, Allergies, Care Teams, Patient Goals)    Allergies (verified) Patient has no known allergies.   Current Medications (verified) Outpatient Encounter Medications as of 02/09/2024  Medication Sig   acetaminophen (TYLENOL) 500 MG tablet Take 1,000 mg by mouth every 6 (six) hours as needed (pain).   albuterol  (PROVENTIL  HFA;VENTOLIN  HFA) 108 (90 Base) MCG/ACT inhaler Inhale 2 puffs into the lungs every 4 (four) hours as  needed.    amLODipine  (NORVASC ) 10 MG tablet TAKE 1 TABLET EVERY DAY   Blood Glucose Monitoring Suppl DEVI 1 each by Does not apply route in the morning, at noon, and at bedtime. May substitute to any manufacturer covered by patient's insurance.   diclofenac  Sodium (VOLTAREN ) 1 % GEL Apply 2 g topically 4 (four) times daily as needed. For right hand pain prn.   EPINEPHrine  0.3 mg/0.3 mL IJ SOAJ injection Inject 0.3 mg into the muscle as needed for anaphylaxis.   Glucose Blood (BLOOD GLUCOSE TEST STRIPS) STRP 1 each by In Vitro route in the morning, at noon, and at bedtime. May substitute to any manufacturer covered by patient's insurance.   Lancets Misc. MISC 1 each by Does not apply route in the morning, at noon, and at bedtime. May substitute to any manufacturer covered by patient's insurance.   levothyroxine  (SYNTHROID ) 75 MCG tablet TAKE 1 TABLET EVERY DAY   rosuvastatin  (CRESTOR ) 10 MG tablet Take 1 tablet (10 mg total) by mouth daily.   triamcinolone  cream (KENALOG ) 0.1 % Apply 1 Application topically 2 (two) times daily.   triamterene -hydrochlorothiazide (MAXZIDE-25) 37.5-25 MG tablet TAKE 1 TABLET EVERY DAY   TRUEplus Lancets 33G MISC TEST BLOOD SUGAR IN THE MORNING, AT NOON, AND AT BEDTIME.   celecoxib  (CELEBREX ) 200 MG capsule TAKE 1 CAPSULE EVERY DAY (Patient not taking: Reported on 02/09/2024)   cyclobenzaprine  (FLEXERIL ) 10 MG tablet Take 1 tablet (10 mg total) by mouth 2 (two) times daily as needed for muscle spasms. (Patient not taking: Reported on 02/09/2024)   No facility-administered encounter medications on file as of 02/09/2024.    History: Past Medical History:  Diagnosis Date   Hypertension    Thyroid  disease    Past Surgical History:  Procedure Laterality Date   ABDOMINAL HYSTERECTOMY     CESAREAN SECTION     ELBOW SURGERY     Family History  Problem Relation Age of Onset   Thyroid  disease Mother    Hypertension Mother    Hypertension Father    Diabetes Father    Breast  cancer Daughter    Social History   Occupational History   Not on file  Tobacco Use   Smoking status: Never   Smokeless tobacco: Never  Substance and Sexual Activity   Alcohol use: No   Drug use: No   Sexual activity: Never    Partners: Male    Birth control/protection: None   Tobacco Counseling Counseling given: Yes  SDOH Screenings   Food Insecurity: No Food Insecurity (02/09/2024)  Housing: Low Risk (02/09/2024)  Transportation Needs: No Transportation Needs (02/09/2024)  Utilities: Not At Risk (02/09/2024)  Alcohol Screen: Low Risk (02/03/2023)  Depression (PHQ2-9): Low Risk (02/09/2024)  Financial Resource Strain: Low Risk (02/03/2023)  Physical Activity: Sufficiently Active (02/09/2024)  Social Connections: Moderately Integrated (02/09/2024)  Stress: Stress Concern Present (02/09/2024)  Tobacco Use: Low Risk (02/09/2024)  Health Literacy: Adequate Health Literacy (02/09/2024)   See flowsheets for full screening details  Depression Screen PHQ 2 & 9 Depression Scale- Over the past 2 weeks, how often have you been bothered by any of the following problems? Little interest or pleasure in doing things: 0 Feeling down, depressed, or hopeless (PHQ Adolescent also includes...irritable): 0 PHQ-2 Total Score: 0 Trouble falling or staying asleep, or sleeping too much: 0 Feeling tired or having little energy: 0 Poor appetite or overeating (PHQ Adolescent also includes...weight loss): 0 Feeling bad about yourself - or that you are a failure  or have let yourself or your family down: 0 Trouble concentrating on things, such as reading the newspaper or watching television (PHQ Adolescent also includes...like school work): 0 Moving or speaking so slowly that other people could have noticed. Or the opposite - being so fidgety or restless that you have been moving around a lot more than usual: 0 Thoughts that you would be better off dead, or of hurting yourself in some way: 0 PHQ-9  Total Score: 0 If you checked off any problems, how difficult have these problems made it for you to do your work, take care of things at home, or get along with other people?: Not difficult at all  Depression Treatment Depression Interventions/Treatment : EYV7-0 Score <4 Follow-up Not Indicated     Goals Addressed               This Visit's Progress     I want to lose about 20lbs (pt-stated)               Objective:    Today's Vitals   02/09/24 0929  BP: 120/80  Weight: 193 lb (87.5 kg)   Body mass index is 35.3 kg/m.  Hearing/Vision screen Hearing Screening - Comments:: Patient denies any hearing difficulties.   Vision Screening - Comments:: Patient sees [facility] in [place]. She isn't up to date on her yearly eye exams. She will call to make her yearly appointment.   Immunizations and Health Maintenance Health Maintenance  Topic Date Due   DTaP/Tdap/Td (1 - Tdap) Never done   OPHTHALMOLOGY EXAM  06/07/2023   COVID-19 Vaccine (4 - 2025-26 season) 10/13/2023   Bone Density Scan  11/10/2023   Medicare Annual Wellness (AWV)  02/03/2024   Fecal DNA (Cologuard)  03/08/2024   HEMOGLOBIN A1C  04/07/2024   Diabetic kidney evaluation - Urine ACR  06/01/2024   FOOT EXAM  06/01/2024   Diabetic kidney evaluation - eGFR measurement  10/05/2024   Mammogram  12/21/2024   Pneumococcal Vaccine: 50+ Years  Completed   Influenza Vaccine  Completed   Hepatitis C Screening  Completed   Zoster Vaccines- Shingrix  Completed   Meningococcal B Vaccine  Aged Out   Colonoscopy  Discontinued        Assessment/Plan:  This is a routine wellness examination for Malania.  Patient Care Team: Tobie Suzzane POUR, MD as PCP - General (Internal Medicine) [facility] (Optometry) Shlomo Fallow, OD [facility])  I have personally reviewed and noted the following in the patients chart:   Medical and social history Use of alcohol, tobacco or illicit drugs   Current medications and supplements including opioid prescriptions. Functional ability and status Nutritional status Physical activity Advanced directives List of other physicians Hospitalizations, surgeries, and ER visits in previous 12 months Vitals Screenings to include cognitive, depression, and falls Referrals and appointments  Orders Placed This Encounter  Procedures   DG Bone Density    Standing Status:   Future    Expiration Date:   02/08/2025    Reason for Exam (SYMPTOM  OR DIAGNOSIS REQUIRED):   post menopausal estrogen deficient    Preferred imaging location?:   [facility]   In addition, I have reviewed and discussed with patient certain preventive protocols, quality metrics, and best practice recommendations. A written personalized care plan for preventive services as well as general preventive health recommendations were provided to patient.   Breeze Angell, CMA   02/09/2024   Return on Wednesday  February 09, 2025 at 9:20am, for [facility] Wellness with Wellness Nurse.  After Visit Summary: (Declined) Due to this being a telephonic visit, with patients personalized plan was offered to patient but patient Declined AVS at this time    "

## 2024-02-11 ENCOUNTER — Inpatient Hospital Stay (HOSPITAL_COMMUNITY): Admission: RE | Admit: 2024-02-11 | Discharge: 2024-02-11 | Attending: Internal Medicine

## 2024-02-11 ENCOUNTER — Ambulatory Visit: Payer: Self-pay | Admitting: Internal Medicine

## 2024-02-11 DIAGNOSIS — Z780 Asymptomatic menopausal state: Secondary | ICD-10-CM | POA: Diagnosis present

## 2024-02-16 ENCOUNTER — Ambulatory Visit: Admitting: Internal Medicine

## 2024-02-18 ENCOUNTER — Other Ambulatory Visit: Payer: Self-pay | Admitting: Internal Medicine

## 2024-02-18 DIAGNOSIS — M1610 Unilateral primary osteoarthritis, unspecified hip: Secondary | ICD-10-CM

## 2024-04-22 ENCOUNTER — Ambulatory Visit: Admitting: Internal Medicine

## 2025-02-09 ENCOUNTER — Ambulatory Visit: Payer: Self-pay
